# Patient Record
Sex: Female | Born: 1960 | Hispanic: No | Marital: Married | Smoking: Former smoker
Health system: Southern US, Community
[De-identification: ages and names within clinical notes are randomized; demographics above are authoritative.]

## PROBLEM LIST (undated history)

## (undated) DIAGNOSIS — F32A Depression, unspecified: Secondary | ICD-10-CM

## (undated) DIAGNOSIS — N189 Chronic kidney disease, unspecified: Secondary | ICD-10-CM

## (undated) DIAGNOSIS — F329 Major depressive disorder, single episode, unspecified: Secondary | ICD-10-CM

## (undated) DIAGNOSIS — R519 Headache, unspecified: Secondary | ICD-10-CM

## (undated) DIAGNOSIS — H9313 Tinnitus, bilateral: Secondary | ICD-10-CM

## (undated) DIAGNOSIS — F419 Anxiety disorder, unspecified: Secondary | ICD-10-CM

## (undated) DIAGNOSIS — K219 Gastro-esophageal reflux disease without esophagitis: Secondary | ICD-10-CM

## (undated) DIAGNOSIS — M948X9 Other specified disorders of cartilage, unspecified sites: Secondary | ICD-10-CM

## (undated) DIAGNOSIS — H9319 Tinnitus, unspecified ear: Secondary | ICD-10-CM

## (undated) DIAGNOSIS — R232 Flushing: Secondary | ICD-10-CM

## (undated) DIAGNOSIS — K5904 Chronic idiopathic constipation: Secondary | ICD-10-CM

## (undated) DIAGNOSIS — R51 Headache: Secondary | ICD-10-CM

## (undated) HISTORY — DX: Flushing: R23.2

## (undated) HISTORY — DX: Gastro-esophageal reflux disease without esophagitis: K21.9

## (undated) HISTORY — DX: Anxiety disorder, unspecified: F41.9

## (undated) HISTORY — DX: Chronic kidney disease, unspecified: N18.9

## (undated) HISTORY — DX: Chronic idiopathic constipation: K59.04

## (undated) HISTORY — PX: OTHER SURGICAL HISTORY: SHX169

## (undated) HISTORY — DX: Other specified disorders of cartilage, unspecified sites: M94.8X9

## (undated) HISTORY — DX: Tinnitus, bilateral: H93.13

## (undated) HISTORY — DX: Tinnitus, unspecified ear: H93.19

## (undated) HISTORY — DX: Headache, unspecified: R51.9

## (undated) HISTORY — PX: TONSILECTOMY, ADENOIDECTOMY, BILATERAL MYRINGOTOMY AND TUBES: SHX2538

## (undated) HISTORY — DX: Depression, unspecified: F32.A

## (undated) HISTORY — DX: Major depressive disorder, single episode, unspecified: F32.9

## (undated) HISTORY — DX: Headache: R51

## (undated) HISTORY — PX: TONSILLECTOMY: SUR1361

## (undated) HISTORY — PX: APPENDECTOMY: SHX54

---

## 2008-07-01 ENCOUNTER — Ambulatory Visit (HOSPITAL_COMMUNITY): Admission: RE | Admit: 2008-07-01 | Discharge: 2008-07-01 | Payer: Self-pay | Admitting: Family Medicine

## 2008-07-29 ENCOUNTER — Other Ambulatory Visit: Admission: RE | Admit: 2008-07-29 | Discharge: 2008-07-29 | Payer: Self-pay | Admitting: Obstetrics and Gynecology

## 2009-09-20 ENCOUNTER — Other Ambulatory Visit: Admission: RE | Admit: 2009-09-20 | Discharge: 2009-09-20 | Payer: Self-pay | Admitting: Obstetrics and Gynecology

## 2011-03-31 ENCOUNTER — Other Ambulatory Visit: Payer: Self-pay | Admitting: Adult Health

## 2011-03-31 ENCOUNTER — Other Ambulatory Visit (HOSPITAL_COMMUNITY)
Admission: RE | Admit: 2011-03-31 | Discharge: 2011-03-31 | Disposition: A | Discharge status: Home / Self Care | Payer: 59 | Source: Ambulatory Visit | Attending: Obstetrics and Gynecology | Admitting: Obstetrics and Gynecology

## 2011-03-31 DIAGNOSIS — Z01419 Encounter for gynecological examination (general) (routine) without abnormal findings: Secondary | ICD-10-CM | POA: Insufficient documentation

## 2011-04-26 ENCOUNTER — Other Ambulatory Visit: Payer: Self-pay | Admitting: Obstetrics and Gynecology

## 2012-04-25 ENCOUNTER — Other Ambulatory Visit: Payer: Self-pay | Admitting: Otolaryngology

## 2012-04-25 DIAGNOSIS — M542 Cervicalgia: Secondary | ICD-10-CM

## 2012-08-28 ENCOUNTER — Other Ambulatory Visit: Payer: Self-pay | Admitting: Adult Health

## 2012-08-28 ENCOUNTER — Other Ambulatory Visit (HOSPITAL_COMMUNITY)
Admission: RE | Admit: 2012-08-28 | Discharge: 2012-08-28 | Disposition: A | Discharge status: Home / Self Care | Payer: No Typology Code available for payment source | Source: Ambulatory Visit | Attending: Obstetrics and Gynecology | Admitting: Obstetrics and Gynecology

## 2012-08-28 DIAGNOSIS — Z1151 Encounter for screening for human papillomavirus (HPV): Secondary | ICD-10-CM | POA: Insufficient documentation

## 2012-08-28 DIAGNOSIS — Z01419 Encounter for gynecological examination (general) (routine) without abnormal findings: Secondary | ICD-10-CM | POA: Insufficient documentation

## 2013-10-15 ENCOUNTER — Encounter: Payer: Self-pay | Admitting: Adult Health

## 2013-10-15 ENCOUNTER — Ambulatory Visit (INDEPENDENT_AMBULATORY_CARE_PROVIDER_SITE_OTHER): Payer: BC Managed Care – PPO | Admitting: Adult Health

## 2013-10-15 VITALS — BP 124/70 | HR 74 | Ht 64.0 in | Wt 152.0 lb

## 2013-10-15 DIAGNOSIS — R232 Flushing: Secondary | ICD-10-CM

## 2013-10-15 DIAGNOSIS — H9319 Tinnitus, unspecified ear: Secondary | ICD-10-CM

## 2013-10-15 DIAGNOSIS — F339 Major depressive disorder, recurrent, unspecified: Secondary | ICD-10-CM | POA: Insufficient documentation

## 2013-10-15 DIAGNOSIS — F329 Major depressive disorder, single episode, unspecified: Secondary | ICD-10-CM

## 2013-10-15 DIAGNOSIS — Z01419 Encounter for gynecological examination (general) (routine) without abnormal findings: Secondary | ICD-10-CM

## 2013-10-15 DIAGNOSIS — Z1212 Encounter for screening for malignant neoplasm of rectum: Secondary | ICD-10-CM

## 2013-10-15 DIAGNOSIS — F32A Depression, unspecified: Secondary | ICD-10-CM

## 2013-10-15 HISTORY — DX: Tinnitus, unspecified ear: H93.19

## 2013-10-15 HISTORY — DX: Flushing: R23.2

## 2013-10-15 LAB — HEMOCCULT GUIAC POC 1CARD (OFFICE): Fecal Occult Blood, POC: NEGATIVE

## 2013-10-15 NOTE — Progress Notes (Signed)
Patient ID: Melanie Jimenez, female   DOB: [birth year], 53 y.o.   MRN: [id number] History of Present Illness: Melanie Jimenez is a 53 year old white female, married in for a physical.She had a normal pap with negative HPV 08/28/12.   Current Medications, Allergies, Past Medical History, Past Surgical History, Family History and Social History were reviewed in [employer] record.   Current outpatient prescriptions:CRESTOR 10 MG tablet, Take 10 mg by mouth daily. , Disp: , Rfl: ;  Escitalopram Oxalate (LEXAPRO PO), Take 35 mg by mouth daily., Disp: , Rfl: ;  LORazepam (ATIVAN) 0.5 MG tablet, Take 0.5 mg by mouth every 6 (six) hours as needed for anxiety., Disp: , Rfl: ;  pantoprazole (PROTONIX) 40 MG tablet, Take 40 mg by mouth daily. , Disp: , Rfl:  zolpidem (AMBIEN) 10 MG tablet, Take 10 mg by mouth at bedtime as needed. , Disp: , Rfl:  Past Medical History  Diagnosis Date  . Depression   . GERD (gastroesophageal reflux disease)   . Tinnitus of both ears   . Hot flashes 10/15/2013  . Tinnitus 10/15/2013   Past Surgical History  Procedure Laterality Date  . Cesarean section    . Lasix surgery    . Tonsilectomy, adenoidectomy, bilateral myringotomy and tubes     Review of Systems: Patient denies any blurred vision, shortness of breath, chest pain, abdominal pain, problems with bowel movements, urination, or intercourse. No joint pain, she has had some headaches and ringing in her ears and depression and has seen Dr Nadara Mustard in [place] and has had meds changed.She has seen and ENT and now has appt 4/20 with neurologist and appt with Dr Sima Matas 11/03/13.She has been out of work for about 4 weeks. She is having hot flashes and night sweats and her last period was about 14 months ago.   Physical Exam:BP 124/70  Pulse 74  Ht 5\' 4"  (1.626 m)  Wt 152 lb (68.947 kg)  BMI 26.08 kg/m2 General:  Well developed, well nourished, no acute distress Skin:  Warm and dry Neck:  Midline trachea, normal  thyroid, no carotid bruits heard Lungs; Clear to auscultation bilaterally Breast:  No dominant palpable mass, retraction, or nipple discharge Cardiovascular: Regular rate and rhythm Abdomen:  Soft, non tender, no hepatosplenomegaly Pelvic:  External genitalia is normal in appearance.  The vagina is normal in appearance. The cervix is bulbous.  Uterus is felt to be normal size, shape, and contour.  No adnexal masses or tenderness noted. Rectal: Good sphincter tone, no polyps, or hemorrhoids felt.  Hemoccult negative. Extremities:  No swelling or varicosities noted Psych:  Alert and cooperative, seems stressed Discussed HRT but wait til after sees neurologist.  Impression: Yearly gyn exam no pap Hot flashes Depression Tinnitus     Plan: Physical in 1 year Mammogram yearly Colonoscopy advised Labs with PCP done last week, and F/U with Dr Nadara Mustard in am Review handouts on HRT and menopause Call after visit with neurologist and will discuss HRT options then

## 2013-10-15 NOTE — Patient Instructions (Signed)
Physical in 1 year Mammogram yearly  colonoscopy at 25 Review handouts on menopause and HRT Menopause Menopause is the normal time of life when menstrual periods stop completely. Menopause is complete when you have missed 12 consecutive menstrual periods. It usually occurs between the ages of 102 years and 21 years. Very rarely does a woman develop menopause before the age of 58 years. At menopause, your ovaries stop producing the female hormones estrogen and progesterone. This can cause undesirable symptoms and also affect your health. Sometimes the symptoms may occur 4 5 years before the menopause begins. There is no relationship between menopause and:  Oral contraceptives.  Number of children you had.  Race.  The age your menstrual periods started (menarche). Heavy smokers and very thin women may develop menopause earlier in life. CAUSES  The ovaries stop producing the female hormones estrogen and progesterone.  Other causes include:  Surgery to remove both ovaries.  The ovaries stop functioning for no known reason.  Tumors of the pituitary gland in the brain.  Medical disease that affects the ovaries and hormone production.  Radiation treatment to the abdomen or pelvis.  Chemotherapy that affects the ovaries. SYMPTOMS   Hot flashes.  Night sweats.  Decrease in sex drive.  Vaginal dryness and thinning of the vagina causing painful intercourse.  Dryness of the skin and developing wrinkles.  Headaches.  Tiredness.  Irritability.  Memory problems.  Weight gain.  Bladder infections.  Hair growth of the face and chest.  Infertility. More serious symptoms include:  Loss of bone (osteoporosis) causing breaks (fractures).  Depression.  Hardening and narrowing of the arteries (atherosclerosis) causing heart attacks and strokes. DIAGNOSIS   When the menstrual periods have stopped for 12 straight months.  Physical exam.  Hormone studies of the  blood. TREATMENT  There are many treatment choices and nearly as many questions about them. The decisions to treat or not to treat menopausal changes is an individual choice made with your health care provider. Your health care provider can discuss the treatments with you. Together, you can decide which treatment will work best for you. Your treatment choices may include:   Hormone therapy (estrogen and progesterone).  Non-hormonal medicines.  Treating the individual symptoms with medicine (for example antidepressants for depression).  Herbal medicines that may help specific symptoms.  Counseling by a psychiatrist or psychologist.  Group therapy.  Lifestyle changes including:  Eating healthy.  Regular exercise.  Limiting caffeine and alcohol.  Stress management and meditation.  No treatment. HOME CARE INSTRUCTIONS   Take the medicine your health care provider gives you as directed.  Get plenty of sleep and rest.  Exercise regularly.  Eat a diet that contains calcium (good for the bones) and soy products (acts like estrogen hormone).  Avoid alcoholic beverages.  Do not smoke.  If you have hot flashes, dress in layers.  Take supplements, calcium, and vitamin D to strengthen bones.  You can use over-the-counter lubricants or moisturizers for vaginal dryness.  Group therapy is sometimes very helpful.  Acupuncture may be helpful in some cases. SEEK MEDICAL CARE IF:   You are not sure you are in menopause.  You are having menopausal symptoms and need advice and treatment.  You are still having menstrual periods after age 37 years.  You have pain with intercourse.  Menopause is complete (no menstrual period for 12 months) and you develop vaginal bleeding.  You need a referral to a specialist (gynecologist, psychiatrist, or psychologist) for treatment. SEEK  IMMEDIATE MEDICAL CARE IF:   You have severe depression.  You have excessive vaginal bleeding.  You  fell and think you have a broken bone.  You have pain when you urinate.  You develop leg or chest pain.  You have a fast pounding heart beat (palpitations).  You have severe headaches.  You develop vision problems.  You feel a lump in your breast.  You have abdominal pain or severe indigestion. Document Released: 09/09/2003 Document Revised: 02/19/2013 Document Reviewed: 01/16/2013 [facility] Patient Information 2014 [place], [place]. Hormone Therapy At menopause, your body begins making less estrogen and progesterone hormones. This causes the body to stop having menstrual periods. This is because estrogen and progesterone hormones control your periods and menstrual cycle. A lack of estrogen may cause symptoms such as:  Hot flushes (or hot flashes).  Vaginal dryness.  Dry skin.  Loss of sex drive.  Risk of bone loss (osteoporosis). When this happens, you may choose to take hormone therapy to get back the estrogen lost during menopause. When the hormone estrogen is given alone, it is usually referred to as ET (Estrogen Therapy). When the hormone progestin is combined with estrogen, it is generally called HT (Hormone Therapy). This was formerly known as hormone replacement therapy (HRT). Your caregiver can help you make a decision on what will be best for you. The decision to use HT seems to change often as new studies are done. Many studies do not agree on the benefits of hormone replacement therapy. LIKELY BENEFITS OF HT INCLUDE PROTECTION FROM:  Hot Flushes (also called hot flashes) - A hot flush is a sudden feeling of heat that spreads over the face and body. The skin may redden like a blush. It is connected with sweats and sleep disturbance. Women going through menopause may have hot flushes a few times a month or several times per day depending on the woman.  Osteoporosis (bone loss)- Estrogen helps guard against bone loss. After menopause, a woman's bones slowly lose calcium and  become weak and brittle. As a result, bones are more likely to break. The hip, wrist, and spine are affected most often. Hormone therapy can help slow bone loss after menopause. Weight bearing exercise and taking calcium with vitamin D also can help prevent bone loss. There are also medications that your caregiver can prescribe that can help prevent osteoporosis.  Vaginal Dryness - Loss of estrogen causes changes in the vagina. Its lining may become thin and dry. These changes can cause pain and bleeding during sexual intercourse. Dryness can also lead to infections. This can cause burning and itching. (Vaginal estrogen treatment can help relieve pain, itching, and dryness.)  Urinary Tract Infections are more common after menopause because of lack of estrogen. Some women also develop urinary incontinence because of low estrogen levels in the vagina and bladder.  Possible other benefits of estrogen include a positive effect on mood and short-term memory in women. RISKS AND COMPLICATIONS  Using estrogen alone without progesterone causes the lining of the uterus to grow. This increases the risk of lining of the uterus (endometrial) cancer. Your caregiver should give another hormone called progestin if you have a uterus.  Women who take combined (estrogen and progestin) HT appear to have an increased risk of breast cancer. The risk appears to be small, but increases throughout the time that HT is taken.  Combined therapy also makes the breast tissue slightly denser which makes it harder to read mammograms (breast X-rays).  Combined, estrogen and  progesterone therapy can be taken together every day, in which case there may be spotting of blood. HT therapy can be taken cyclically in which case you will have menstrual periods. Cyclically means HT is taken for a set amount of days, then not taken, then this process is repeated.  HT may increase the risk of stroke, heart attack, breast cancer and forming  blood clots in your leg.  Transdermal estrogen (estrogen that is absorbed through the skin with a patch or a cream) may have more positive results with:  Cholesterol.  Blood pressure.  Blood clots. Having the following conditions may indicate you should not have HT:  Endometrial cancer.  Liver disease.  Breast cancer.  Heart disease.  History of blood clots.  Stroke. TREATMENT   If you choose to take HT and have a uterus, usually estrogen and progestin are prescribed.  Your caregiver will help you decide the best way to take the medications.  Possible ways to take estrogen include:  Pills.  Patches.  Gels.  Sprays.  Vaginal estrogen cream, rings and tablets.  It is best to take the lowest dose possible that will help your symptoms and take them for the shortest period of time that you can.  Hormone therapy can help relieve some of the problems (symptoms) that affect women at menopause. Before making a decision about HT, talk to your caregiver about what is best for you. Be well informed and comfortable with your decisions. HOME CARE INSTRUCTIONS   Follow your caregivers advice when taking the medications.  A Pap test is done to screen for cervical cancer.  The first Pap test should be done at age 30.  Between ages 83 and 71, Pap tests are repeated every 2 years.  Beginning at age 54, you are advised to have a Pap test every 3 years as long as your past 3 Pap tests have been normal.  Some women have medical problems that increase the chance of getting cervical cancer. Talk to your caregiver about these problems. It is especially important to talk to your caregiver if a new problem develops soon after your last Pap test. In these cases, your caregiver may recommend more frequent screening and Pap tests.  The above recommendations are the same for women who have or have not gotten the vaccine for HPV (Human Papillomavirus).  If you had a hysterectomy for a  problem that was not a cancer or a condition that could lead to cancer, then you no longer need Pap tests. However, even if you no longer need a Pap test, a regular exam is a good idea to make sure no other problems are starting.   If you are between ages 57 and 90, and you have had normal Pap tests going back 10 years, you no longer need Pap tests. However, even if you no longer need a Pap test, a regular exam is a good idea to make sure no other problems are starting.   If you have had past treatment for cervical cancer or a condition that could lead to cancer, you need Pap tests and screening for cancer for at least 20 years after your treatment.  If Pap tests have been discontinued, risk factors (such as a new sexual partner) need to be re-assessed to determine if screening should be resumed.  Some women may need screenings more often if they are at high risk for cervical cancer.  Get mammograms done as per the advice of your caregiver. SEEK IMMEDIATE  MEDICAL CARE IF:  You develop abnormal vaginal bleeding.  You have pain or swelling in your legs, shortness of breath, or chest pain.  You develop dizziness or headaches.  You have lumps or changes in your breasts or armpits.  You have slurred speech.  You develop weakness or numbness of your arms or legs.  You have pain, burning, or bleeding when urinating.  You develop abdominal pain. Document Released: 03/18/2003 Document Revised: 09/11/2011 Document Reviewed: 07/06/2010 [facility] Patient Information 2014 [place], [place]. Call me back

## 2013-10-20 ENCOUNTER — Encounter: Payer: Self-pay | Admitting: Neurology

## 2013-10-20 ENCOUNTER — Ambulatory Visit (INDEPENDENT_AMBULATORY_CARE_PROVIDER_SITE_OTHER): Payer: BC Managed Care – PPO | Admitting: Neurology

## 2013-10-20 VITALS — BP 115/77 | HR 87 | Ht 64.0 in | Wt 152.0 lb

## 2013-10-20 DIAGNOSIS — G44209 Tension-type headache, unspecified, not intractable: Secondary | ICD-10-CM | POA: Insufficient documentation

## 2013-10-20 DIAGNOSIS — F411 Generalized anxiety disorder: Secondary | ICD-10-CM

## 2013-10-20 DIAGNOSIS — F419 Anxiety disorder, unspecified: Secondary | ICD-10-CM | POA: Insufficient documentation

## 2013-10-20 DIAGNOSIS — H9319 Tinnitus, unspecified ear: Secondary | ICD-10-CM

## 2013-10-20 NOTE — Patient Instructions (Signed)
I had a long discussion with patient with regards to her chronic tinnitus and underlying anxiety and depression. Recommend checking MRI scan of the brain and IAC, MRA of the brain to rule out structural or vascular lesions. Head recommend she take Ativan 0.5 mg twice daily in addition to her Lexapro to address underlying anxiety and depression. I also advised her to increase her participation in regular activity for stress relaxation to help with the tension headaches and to continue to use ibuprofen as needed but to limit to not more than 3 days per week. Return for followup in 6 weeks or call earlier if necessary.  Tinnitus Sounds you hear in your ears and coming from within the ear is called tinnitus. This can be a symptom of many ear disorders. It is often associated with hearing loss.  Tinnitus can be seen with:  Infections.  Ear blockages such as wax buildup.  Meniere's disease.  Ear damage.  Inherited.  Occupational causes. While irritating, it is not usually a threat to health. When the cause of the tinnitus is wax, infection in the middle ear, or foreign body it is easily treated. Hearing loss will usually be reversible.  TREATMENT  When treating the underlying cause does not get rid of tinnitus, it may be necessary to get rid of the unwanted sound by covering it up with more pleasant background noises. This may include music, the radio etc. There are tinnitus maskers which can be worn which produce background noise to cover up the tinnitus. Avoid all medications which tend to make tinnitus worse such as alcohol, caffeine, aspirin, and nicotine. There are many soothing background tapes such as rain, ocean, thunderstorms, etc. These soothing sounds help with sleeping or resting. Keep all follow-up appointments and referrals. This is important to identify the cause of the problem. It also helps avoid complications, impaired hearing, disability, or chronic pain. Document Released:  06/19/2005 Document Revised: 09/11/2011 Document Reviewed: 02/05/2008 [facility] Patient Information 2014 [place].  Tension Headache A tension headache is a feeling of pain, pressure, or aching often felt over the front and sides of the head. The pain can be dull or can feel tight (constricting). It is the most common type of headache. Tension headaches are not normally associated with nausea or vomiting and do not get worse with physical activity. Tension headaches can last 30 minutes to several days.  CAUSES  The exact cause is not known, but it may be caused by chemicals and hormones in the brain that lead to pain. Tension headaches often begin after stress, anxiety, or depression. Other triggers may include:  Alcohol.  Caffeine (too much or withdrawal).  Respiratory infections (colds, flu, sinus infections).  Dental problems or teeth clenching.  Fatigue.  Holding your head and neck in one position too long while using a computer. SYMPTOMS   Pressure around the head.   Dull, aching head pain.   Pain felt over the front and sides of the head.   Tenderness in the muscles of the head, neck, and shoulders. DIAGNOSIS  A tension headache is often diagnosed based on:   Symptoms.   Physical examination.   A CT scan or MRI of your head. These tests may be ordered if symptoms are severe or unusual. TREATMENT  Medicines may be given to help relieve symptoms.  HOME CARE INSTRUCTIONS   Only take over-the-counter or prescription medicines for pain or discomfort as directed by your caregiver.   Lie down in a dark, quiet room  when you have a headache.   Keep a journal to find out what may be triggering your headaches. For example, write down:  What you eat and drink.  How much sleep you get.  Any change to your diet or medicines.  Try massage or other relaxation techniques.   Ice packs or heat applied to the head and neck can be used. Use these 3 to 4 times per  day for 15 to 20 minutes each time, or as needed.   Limit stress.   Sit up straight, and do not tense your muscles.   Quit smoking if you smoke.  Limit alcohol use.  Decrease the amount of caffeine you drink, or stop drinking caffeine.  Eat and exercise regularly.  Get 7 to 9 hours of sleep, or as recommended by your caregiver.  Avoid excessive use of pain medicine as recurrent headaches can occur.  SEEK MEDICAL CARE IF:   You have problems with the medicines you were prescribed.  Your medicines do not work.  You have a change from the usual headache.  You have nausea or vomiting. SEEK IMMEDIATE MEDICAL CARE IF:   Your headache becomes severe.  You have a fever.  You have a stiff neck.  You have loss of vision.  You have muscular weakness or loss of muscle control.  You lose your balance or have trouble walking.  You feel faint or pass out.  You have severe symptoms that are different from your first symptoms. MAKE SURE YOU:   Understand these instructions.  Will watch your condition.  Will get help right away if you are not doing well or get worse. Document Released: 06/19/2005 Document Revised: 09/11/2011 Document Reviewed: 06/09/2011 [facility] Patient Information 2014 [place], [place].

## 2013-10-21 NOTE — Progress Notes (Signed)
[facility] [address] [place]. [place] [address] [phone]       OFFICE CONSULT NOTE  Melanie Jimenez Date of Birth:  1960-11-19 Medical Record Number:  [id number]   Referring MD:  Nobie Putnam  Reason for Referral:  Headache and tinnitus HPI: 63 year lady with chronic bilateral ringing sound in the ears for last 5 years. This appears to have increased in the last 6 months to a more pronounced rolling and swishing sound. She has been evaluated by Dr. Redmond Pulling ENT physician in [place] who has done audiometry in our office which has been okay. Repeat audiometry recently showed stable appearance over 5 years. She denies any accompanying vertigo, or double vision, balance or gait difficulties. The tinnitus increases with certain noises like a vacuum cleaner and at times she has to wear earplugs to decrease this sound. Going to sleep helps reduce this tinnitus. She occasionally feels slightly off balance when she tries to get up to this feeling does not last. She has not noticed any loss of hearing. There is no history of head injury with loss of consciousness, years fracture, bleeding or recurrent infections. She has been complaining of the daily headaches for the last 3-4 months which she describes as a frontal mild to moderate in severity pressure-like in quality 5/10 in severity. The headaches are not accompanied by nausea, vomiting, light or sound sensitivity. Headache does not increase his physical activity and she is able to continue to work despite the headache. She takes ibuprofen which seems to relieve the headaches. The patient has been taking Ativan off and on over the years to have tinnitus which helps a little PA she takes this about 3-4 times per week only. She has long-standing history of depression for which she had been on Celexa which was recently changed to Lexapro by her primary physician. She has not been on Klonopin. She has tried Xanax in the past. She has no  family history of aneurysms, edema admission, brain hemorrhage. She remembers having had brain scans from 5 years ago but I'm not able to find records of these. She does admit to some increased stress in her life last 6 months is not sure if this may have affected her ranging sound in the ears. ROS:   14 system review of systems is positive for hearing loss, ringing in ears, feeling hot, headache, dizziness, anxiety, depression, decreased energy, insomnia, sleepiness and all other systems negative PMH:  Past Medical History  Diagnosis Date  . Depression   . GERD (gastroesophageal reflux disease)   . Tinnitus of both ears   . Hot flashes 10/15/2013  . Tinnitus 10/15/2013    Social History:  History   Social History  . Marital Status: Unknown    Spouse Name: N/A    Number of Children: 1  . Years of Education: N/A   Occupational History  . Not on file.   Social History Main Topics  . Smoking status: Current Every Day Smoker -- 1.00 packs/day    Types: Cigarettes  . Smokeless tobacco: Never Used  . Alcohol Use: No  . Drug Use: No  . Sexual Activity: Yes    Birth Control/ Protection: Other-see comments     Comment: vasectomy   Other Topics Concern  . Not on file   Social History Narrative   Patient lives at home with her husband.    Medications:   Current Outpatient Prescriptions on File Prior to Visit  Medication Sig Dispense  Refill  . CRESTOR 10 MG tablet Take 10 mg by mouth daily.       . Escitalopram Oxalate (LEXAPRO PO) Take 35 mg by mouth daily.      Marland Kitchen LORazepam (ATIVAN) 0.5 MG tablet Take 0.5 mg by mouth every 6 (six) hours as needed for anxiety.      . pantoprazole (PROTONIX) 40 MG tablet Take 40 mg by mouth daily.       Marland Kitchen zolpidem (AMBIEN) 10 MG tablet Take 10 mg by mouth at bedtime as needed.        No current facility-administered medications on file prior to visit.    Allergies:   Allergies  Allergen Reactions  . Wellbutrin [Bupropion] Other (See  Comments)    Seizures and passed out    Physical Exam General: well developed, well nourished, seated, in no evident distress Head: head normocephalic and atraumatic. Orohparynx benign Neck: supple with no carotid or supraclavicular bruits Cardiovascular: regular rate and rhythm, no murmurs Musculoskeletal: no deformity Skin:  no rash/petichiae Vascular:  Normal pulses all extremities Filed Vitals:   10/20/13 1429  BP: 115/77  Pulse: 87    Neurologic Exam Mental Status: Awake and fully alert. Oriented to place and time. Recent and remote memory intact. Attention span, concentration and fund of knowledge appropriate. Mood and affect appropriate.  Cranial Nerves: Fundoscopic exam reveals sharp disc margins. Pupils equal, briskly reactive to light. Extraocular movements full without nystagmus. Visual fields full to confrontation. Hearing intact. Conduction greater than bone conduction in both ears. Facial sensation intact. Face, tongue, palate moves normally and symmetrically.  Motor: Normal bulk and tone. Normal strength in all tested extremity muscles. Sensory.: intact to touch and pinprick and vibration.  Coordination: Rapid alternating movements normal in all extremities. Finger-to-nose and heel-to-shin performed accurately bilaterally. Gait and Station: Arises from chair without difficulty. Stance is normal. Gait demonstrates normal stride length and balance . Able to heel, toe and tandem walk without difficulty.  Reflexes: 1+ and symmetric. Toes downgoing.      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 Recommend checking MRI scan of the brain and IAC, MRA of the brain to rule  out structural or vascular lesions. Head recommend she take Ativan 0.5 mg twice daily in addition to her Lexapro to address underlying anxiety and depression. I also advised her to increase her participation in regular activity for stress relaxation to help with the tension headaches and to continue to use ibuprofen as needed but to limit to not more than 3 days per week. Return for followup in 6 weeks or call earlier if necessary.   Note: This document was prepared with digital dictation and possible smart phrase technology. Any transcriptional errors that result from this process are unintentional.

## 2013-10-22 ENCOUNTER — Ambulatory Visit (INDEPENDENT_AMBULATORY_CARE_PROVIDER_SITE_OTHER): Payer: BC Managed Care – PPO

## 2013-10-22 DIAGNOSIS — G44209 Tension-type headache, unspecified, not intractable: Secondary | ICD-10-CM

## 2013-10-22 DIAGNOSIS — H9319 Tinnitus, unspecified ear: Secondary | ICD-10-CM

## 2013-10-22 MED ORDER — GADOPENTETATE DIMEGLUMINE 469.01 MG/ML IV SOLN: 15.0000 mL | Freq: Once | INTRAVENOUS | Status: AC | PRN

## 2013-10-28 ENCOUNTER — Telehealth: Payer: Self-pay | Admitting: Neurology

## 2013-10-28 NOTE — Telephone Encounter (Signed)
Pt called would like for someone to call her concerning the results from her MRI done on 10/22/13. Thanks

## 2013-10-28 NOTE — Telephone Encounter (Signed)
Pt is calling requesting MRI results. Please advise

## 2013-10-29 NOTE — Telephone Encounter (Signed)
Pt called back is concerned about not hearing on her results. Pt is waiting to hear back due to she has to get these results to her Dr. So that he can figure out what medication she needs to be on. Please call pt concerning this matter. Thanks

## 2013-10-30 ENCOUNTER — Telehealth: Payer: Self-pay | Admitting: *Deleted

## 2013-10-30 NOTE — Telephone Encounter (Signed)
Called patient home and left message that MRIs came back fine. Call back if questions otherwise keep f/u appointment

## 2013-10-30 NOTE — Telephone Encounter (Signed)
Pt state saw neurologist, MRI clear, Melanie Jimenez to discuss HRT. Pt informed Melanie Jimenez out of office until Monday will route message.

## 2013-10-31 ENCOUNTER — Ambulatory Visit (HOSPITAL_COMMUNITY): Payer: Self-pay | Admitting: Psychology

## 2013-11-03 ENCOUNTER — Ambulatory Visit (HOSPITAL_COMMUNITY): Payer: Self-pay | Admitting: Psychology

## 2013-11-03 ENCOUNTER — Telehealth: Payer: Self-pay | Admitting: *Deleted

## 2013-11-03 ENCOUNTER — Telehealth: Payer: Self-pay | Admitting: Adult Health

## 2013-11-03 MED ORDER — PROGESTERONE MICRONIZED 200 MG PO CAPS: ORAL_CAPSULE | ORAL | Status: DC

## 2013-11-03 MED ORDER — ESTRADIOL 0.52 MG/0.87 GM (0.06%) TD GEL: 1.0000 "application " | Freq: Every day | TRANSDERMAL | Status: DC

## 2013-11-03 NOTE — Telephone Encounter (Signed)
Pt saw Dr Nadara Mustard and he said OK to HRT and also changed her from lexapro to zoloft, will rx elestrin and prometrium, follow up in 3  Months with me

## 2013-11-03 NOTE — Telephone Encounter (Signed)
Pt wants to know what medication she will be given for HRT. A gel or lotion and another pill.

## 2013-11-03 NOTE — Telephone Encounter (Signed)
Pt calling back in regards to a previous message on Thursday for Derrek Monaco, NP. Pt states at home today can be reached at [phone], requesting HRT medication.

## 2013-11-03 NOTE — Telephone Encounter (Signed)
Has appt with PCP, OK with him taking HRT will rx elestrin and prometrium if he is OK with it.

## 2013-11-12 ENCOUNTER — Ambulatory Visit (INDEPENDENT_AMBULATORY_CARE_PROVIDER_SITE_OTHER): Payer: BC Managed Care – PPO | Admitting: Psychology

## 2013-11-12 DIAGNOSIS — F3289 Other specified depressive episodes: Secondary | ICD-10-CM

## 2013-11-12 DIAGNOSIS — F329 Major depressive disorder, single episode, unspecified: Secondary | ICD-10-CM

## 2013-11-12 DIAGNOSIS — G4700 Insomnia, unspecified: Secondary | ICD-10-CM

## 2013-12-04 ENCOUNTER — Ambulatory Visit (HOSPITAL_COMMUNITY): Payer: Self-pay | Admitting: Psychology

## 2013-12-12 ENCOUNTER — Encounter (HOSPITAL_COMMUNITY): Payer: Self-pay | Admitting: Psychology

## 2013-12-12 NOTE — Progress Notes (Signed)
PROGRESS NOTE  Patient:   Melanie Jimenez   DOB:   April 21, 1961  MR Number:  [id number]  Location:  [facility] [address] [place] [address] Dept: [phone]           Date of Service:   11/12/2013  Start Time:   3 PM End Time:   4 PM  Provider/Observer:  Edgardo Roys PSYD       Billing Code/Service: [phone]  Chief Complaint:     Chief Complaint  Patient presents with  . Depression  . Other    Insomnia    Reason for Service:  The patient was referred by Dr. Redmond Pulling who is her ENT. The patient is a 53 year old female who is been dealing with depression partly due to menopause as well as issues with TMJ and audiological issues. The patient describes constant ringing in her ears and tinnitus as well as a sense of hearing her heartbeat in her ears. The patient reports that this tinnitus is been going on for 5 years and a more worsening symptoms over the past 6 or 7. The patient reports that she's feeling increasingly depressed because she is not able to work and has no energy. The patient describes TMJ pain, inability to sleep, bruxism,. The patient reports that she has seen both a neurologist and her ENT. The patient reports that she has no balance issues and that she is getting some help from a mouth guard. The patient reports that she has been going through menopause as well. She is currently taking Zoloft in the past of both Celexa and Lexapro. A major issue that is going on with her now is that in October of the neighbors move in next door with 5 children and 12 dogs. The dogs bark all of the time both day and night. Tumor change in the backyard and she and her: Share about them. After several weeks this the family 2K dogs but she continues to have problems with her children the other dogs.  Current Status:  The patient describes moderate to significant symptoms of depression, anxiety, mood  changes, sleep disturbance, racing thoughts, memory difficulties, loss of interest, excessive worrying, low energy, obsessive thoughts, changes in sex drive and poor concentration.  Reliability of Information: The information is provided by the patient as well as review of available medical records.  Behavioral Observation: Melanie Jimenez  presents as a 53 y.o.-year-old Right Caucasian Female who appeared her stated age. her dress was Appropriate and she was Well Groomed and her manners were Appropriate to the situation.  There were not any physical disabilities noted.  she displayed an appropriate level of cooperation and motivation.    Interactions:    Active   Attention:   within normal limits  Memory:   within normal limits  Visuo-spatial:   within normal limits  Speech (Volume):  normal  Speech:   normal pitch  Thought Process:  Coherent  Though Content:  WNL  Orientation:   person, place, time/date and situation  Judgment:   Good  Planning:   Good  Affect:    Depressed  Mood:    Depressed  Insight:   Good  Intelligence:   normal  Marital Status/Living: The patient was born and raised in [place]. She is married and has a 23 year old daughter. The patient has a 56 year old brother and a 46 year old brother.  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  Past Employment:    Substance Use:  No concerns of substance abuse are reported.  the patient denies any history of substance abuse. She has not had any alcohol since at least 2000 working.  Education:   HS Graduate  Medical History:   Past Medical History  Diagnosis Date  . Depression   . GERD (gastroesophageal reflux disease)   . Tinnitus of both ears   . Hot flashes 10/15/2013  . Tinnitus 10/15/2013        Outpatient Encounter Prescriptions as of 11/12/2013  Medication Sig  . CRESTOR 10 MG tablet Take 10 mg by mouth  daily.   . Escitalopram Oxalate (LEXAPRO PO) Take 35 mg by mouth daily.  . Estradiol 0.52 MG/0.87 GM (0.06%) GEL Apply 1 application topically daily.  Marland Kitchen LORazepam (ATIVAN) 0.5 MG tablet Take 0.5 mg by mouth every 6 (six) hours as needed for anxiety.  . pantoprazole (PROTONIX) 40 MG tablet Take 40 mg by mouth daily.   . progesterone (PROMETRIUM) 200 MG capsule Take 1 at bedtime  . zolpidem (AMBIEN) 10 MG tablet Take 10 mg by mouth at bedtime as needed.           Sexual History:   History  Sexual Activity  . Sexual Activity: Yes  . Birth Control/ Protection: Other-see comments    Comment: vasectomy    Abuse/Trauma History: The patient denies any history of abuse or trauma. However, she has been extremely stressed by her new neighbors who have many kids in dogs work possibly causing trouble around her house.  Psychiatric History:  The patient does report that she has had difficulties with depression the past. She reports that she did not have any significant benefit from either Celexa or Lexapro in the past. She reports that she's been taking Zoloft for one week.  Family Med/Psych History:  Family History  Problem Relation Age of Onset  . Diabetes Mother   . Diabetes Father   . Diabetes Maternal Grandmother   . Diabetes Maternal Grandfather     Risk of Suicide/Violence: virtually non-existent the patient denies any suicidal ideation.  Impression/DX:  The patient has not been going through menopause recently but she is also been developing a lot of anxiety and depression symptoms with significant tinnitus in her years, a roaring and sound of her heartbeat, bruxism, and other TMJ symptoms. The patient reports that she has a lot of stress going on in her life as well primarily related to her new Neighbors.  Disposition/Plan:  We will set the patient for individual psychotherapeutic interventions as well as looking at any recommendations to facilitate with psychotropic  interventions.  Diagnosis:    Axis I:  Depressive disorder, not elsewhere classified  Insomnia      Axis II: No diagnosis       Axis III:  Tinnitus, bruxism, and other ENT difficulties.      Axis IV:  housing problems          Axis V:  51-60 moderate symptoms

## 2014-01-22 ENCOUNTER — Other Ambulatory Visit: Payer: Self-pay | Admitting: *Deleted

## 2014-01-22 ENCOUNTER — Encounter: Payer: Self-pay | Admitting: Vascular Surgery

## 2014-01-22 DIAGNOSIS — R0989 Other specified symptoms and signs involving the circulatory and respiratory systems: Secondary | ICD-10-CM

## 2014-01-23 ENCOUNTER — Ambulatory Visit (INDEPENDENT_AMBULATORY_CARE_PROVIDER_SITE_OTHER): Payer: BC Managed Care – PPO | Admitting: Vascular Surgery

## 2014-01-23 ENCOUNTER — Encounter: Payer: Self-pay | Admitting: Vascular Surgery

## 2014-01-23 ENCOUNTER — Ambulatory Visit (HOSPITAL_COMMUNITY)
Admission: RE | Admit: 2014-01-23 | Discharge: 2014-01-23 | Disposition: A | Discharge status: Home / Self Care | Payer: BC Managed Care – PPO | Source: Ambulatory Visit | Attending: Vascular Surgery | Admitting: Vascular Surgery

## 2014-01-23 VITALS — BP 121/85 | HR 80 | Ht 64.0 in | Wt 152.8 lb

## 2014-01-23 DIAGNOSIS — G5691 Unspecified mononeuropathy of right upper limb: Secondary | ICD-10-CM | POA: Insufficient documentation

## 2014-01-23 DIAGNOSIS — G5690 Unspecified mononeuropathy of unspecified upper limb: Secondary | ICD-10-CM

## 2014-01-23 DIAGNOSIS — R0989 Other specified symptoms and signs involving the circulatory and respiratory systems: Secondary | ICD-10-CM | POA: Diagnosis present

## 2014-01-23 NOTE — Progress Notes (Signed)
[facility]  Referred by:  Rory Percy, MD [address], [place] [address]  Reason for referral: prominent pulsation of the right wrist  History of Present Illness  Melanie Jimenez is a 53 y.o. ([birth year]) female who presents with chief complaint: prominent pulsation of the right wrist x 5 days. She also describes knots in her right antecubital fossa and intermittent burning and numbness of her right forearm x 3 years. She says these symptoms started immediately following a blood draw in 2012. She said the phlebotomist was unsuccessful at drawing her blood and had to try multiple times. She has seen a neurologist about this issue and underwent an MRI. The results were normal. She has not tried any neuropathic medications such as gabapentin.   She denies any history of pain or pallor in the right hand and non-healing wounds of her right hand. She has no family history of aneurysm. She denies a previous history of stroke and clotting disorders.   Past Medical History  Diagnosis Date  . Depression   . GERD (gastroesophageal reflux disease)   . Tinnitus of both ears   . Hot flashes 10/15/2013  . Tinnitus 10/15/2013    Past Surgical History  Procedure Laterality Date  . Cesarean section    . Lasix surgery    . Tonsilectomy, adenoidectomy, bilateral myringotomy and tubes      History   Social History  . Marital Status: Unknown    Spouse Name: N/A    Number of Children: 1  . Years of Education: N/A   Occupational History  . Not on file.   Social History Main Topics  . Smoking status: Current Every Day Smoker -- 1.00 packs/day    Types: Cigarettes  . Smokeless tobacco: Never Used  . Alcohol Use: Yes     Comment: occasional  . Drug Use: No  . Sexual Activity: Yes    Birth Control/ Protection: Other-see comments     Comment: vasectomy   Other Topics Concern  . Not on file   Social History Narrative   Patient lives at home with her husband.     Family History  Problem Relation Age of Onset  . Diabetes Mother   . Hyperlipidemia Mother   . Diabetes Father   . Peripheral vascular disease Father   . Diabetes Maternal Grandmother   . Diabetes Maternal Grandfather     No family history of aneurysms.   Current Outpatient Prescriptions on File Prior to Visit  Medication Sig Dispense Refill  . CRESTOR 10 MG tablet Take 10 mg by mouth daily.       . Escitalopram Oxalate (LEXAPRO PO) Take 35 mg by mouth daily.      Marland Kitchen LORazepam (ATIVAN) 0.5 MG tablet Take 0.5 mg by mouth every 6 (six) hours as needed for anxiety.      . progesterone (PROMETRIUM) 200 MG capsule Take 1 at bedtime  30 capsule  6  . zolpidem (AMBIEN) 10 MG tablet Take 10 mg by mouth at bedtime as needed.       . Estradiol 0.52 MG/0.87 GM (0.06%) GEL Apply 1 application topically daily.  26 g  6  . pantoprazole (PROTONIX) 40 MG tablet Take 40 mg by mouth daily.        No current facility-administered medications on file prior to visit.    Allergies  Allergen Reactions  . Wellbutrin [Bupropion] Other (See Comments)    Seizures and passed out  REVIEW OF SYSTEMS:  (Positives checked otherwise negative)  CARDIOVASCULAR:  []  chest pain, []  chest pressure, []  palpitations, []  shortness of breath when laying flat, []  shortness of breath with exertion,  []  pain in feet when walking, []  pain in feet when laying flat, []  history of blood clot in veins (DVT), []  history of phlebitis, []  swelling in legs, []  varicose veins  PULMONARY:  []  productive cough, []  asthma, []  wheezing  NEUROLOGIC:  []  weakness in arms or legs, [x]  numbness in arms or legs, []  difficulty speaking or slurred speech, []  temporary loss of vision in one eye, []  dizziness  HEMATOLOGIC:  []  bleeding problems, []  problems with blood clotting too easily  MUSCULOSKEL:  []  joint pain, []  joint swelling  GASTROINTEST:  []  vomiting blood, []  blood in stool     GENITOURINARY:  []  burning with  urination, []  blood in urine  PSYCHIATRIC:  []  history of major depression  INTEGUMENTARY:  []  rashes, []  ulcers  CONSTITUTIONAL:  []  fever, []  chills  For VQI Use Only  Physical Examination  Filed Vitals:   01/23/14 1314  BP: 121/85  Pulse: 80  Height: 5\' 4"  (1.626 m)  Weight: 152 lb 12.8 oz (69.31 kg)  SpO2: 100%    Body mass index is 26.22 kg/(m^2).  General: A&O x 3, WDWN in NAD  Head: [place]/AT  Eyes: PERRLA, EOMI  Neck: Supple  Pulmonary: Sym exp, good air movt, CTAB, no rales, rhonchi, & wheezing  Cardiac: RRR, Nl S1, S2, no Murmurs, rubs or gallops  Vascular: Prominent pulsation of right ulnar artery. No ischemic changes of right hand. Palpable cord in medial aspect of right forearm.  Vessel Right Left  Radial Palpable Palpable  Ulnar Palpable Palpable  Brachial Palpable Palpable  Carotid Palpable, without bruit Palpable, without bruit  Aorta Not palpable N/A  Femoral Palpable Palpable  Popliteal Not palpable Not palpable  PT  Palpable  Palpable  DP  Palpable  Palpable   Gastrointestinal: soft, NTND, -G/R, - HSM, - masses  Musculoskeletal: M/S 5/5 throughout, Extremities without ischemic changes.   Neurologic: CN 2-12 intact. Pain and light touch intact in extremities   Psychiatric: Judgment intact, Mood & affect appropriate for pt's clinical situation  Dermatologic: See M/S exam for extremity exam, no rashes otherwise noted  Non-Invasive Vascular Imaging  Upper extremity arterial duplex   Normal arterial waveforms and flow in the right brachial, radial and ulnar arteries in right extremity.   Medical Decision Making  Melanie Jimenez is a 53 y.o. 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    Ultrasound of the radial and ulnar arteries revealed no evidence of aneurysm with normal arterial waveforms. Discussed with the patient that she has a more prominent ulnar artery that is a  normal anatomical variant.   Advised patient to take non-steroidal antiinflammatory medications for thrombophlebitis pain.  Follow-up as needed. Patient informed to return to office if pulse became more prominent or had evidence of embolic disease in the hand.    Virgina Jock, PA-C [facility] Office: [phone] Pager: [phone]  01/23/2014, 2:05 PM  This patient was seen in conjunction with Dr. Bridgett Larsson.   Addendum  I have independently interviewed and examined the patient, and I agree with the physician assistant's findings.  Pt has painful palpable cord in forearm.  I duplexed the wrist to evaluate the ulnar and radial arteries and found to abnormalities in the arteries or  surround tissue.  It is no clear to me why her wrist arteries suddenly became more prominent in her R arm.  There is no evidence of distal embolization in the right hand that might give a more prominent pulse.  Adele Barthel, MD [facility] Office: [phone] Pager: [phone]  01/23/2014, 3:07 PM

## 2014-02-03 ENCOUNTER — Ambulatory Visit: Payer: Self-pay | Admitting: Neurology

## 2014-03-02 ENCOUNTER — Ambulatory Visit (INDEPENDENT_AMBULATORY_CARE_PROVIDER_SITE_OTHER): Payer: BC Managed Care – PPO | Admitting: Neurology

## 2014-03-02 ENCOUNTER — Encounter: Payer: Self-pay | Admitting: Neurology

## 2014-03-02 ENCOUNTER — Ambulatory Visit: Payer: BC Managed Care – PPO | Admitting: Neurology

## 2014-03-02 ENCOUNTER — Encounter: Payer: Self-pay | Admitting: *Deleted

## 2014-03-02 VITALS — Ht 64.0 in

## 2014-03-02 DIAGNOSIS — M25531 Pain in right wrist: Secondary | ICD-10-CM

## 2014-03-02 DIAGNOSIS — M25539 Pain in unspecified wrist: Secondary | ICD-10-CM

## 2014-03-02 DIAGNOSIS — G5641 Causalgia of right upper limb: Secondary | ICD-10-CM | POA: Insufficient documentation

## 2014-03-02 DIAGNOSIS — G5640 Causalgia of unspecified upper limb: Secondary | ICD-10-CM

## 2014-03-02 DIAGNOSIS — G90511 Complex regional pain syndrome I of right upper limb: Secondary | ICD-10-CM

## 2014-03-02 DIAGNOSIS — M8900 Algoneurodystrophy, unspecified site: Secondary | ICD-10-CM

## 2014-03-02 MED ORDER — TOPIRAMATE 50 MG PO TABS: 50.0000 mg | ORAL_TABLET | Freq: Two times a day (BID) | ORAL | Status: DC

## 2014-03-02 NOTE — Patient Instructions (Addendum)
I had a long discussion with the patient and her mother regarding her right wrist pulsatile swelling, discussed the differential diagnosis, plan for evaluation and treatment and answered questions. Trial of Topamax 50 mg twice daily for causalgia and neuropathy pain. Check EMG nerve conduction study for median nerve damage and MRI scan of the right wrist soft tissues to evaluate the pulsatile swelling.The patient was advised to not return to work till about evaluation is completed. Return for followup in 2 months or call earlier if necessary. Neuropathic Pain We often think that pain has a physical cause. If we get rid of the cause, the pain should go away. Nerves themselves can also cause pain. It is called neuropathic pain, which means nerve abnormality. It may be difficult for the patients who have it and for the treating caregivers. Pain is usually described as acute (short-lived) or chronic (long-lasting). Acute pain is related to the physical sensations caused by an injury. It can last from a few seconds to many weeks, but it usually goes away when normal healing occurs. Chronic pain lasts beyond the typical healing time. With neuropathic pain, the nerve fibers themselves may be damaged or injured. They then send incorrect signals to other pain centers. The pain you feel is real, but the cause is not easy to find.  CAUSES  Chronic pain can result from diseases, such as diabetes and shingles (an infection related to chickenpox), or from trauma, surgery, or amputation. It can also happen without any known injury or disease. The nerves are sending pain messages, even though there is no identifiable cause for such messages.   Other common causes of neuropathy include diabetes, phantom limb pain, or Regional Pain Syndrome (RPS).  As with all forms of chronic back pain, if neuropathy is not correctly treated, there can be a number of associated problems that lead to a downward cycle for the patient. These  include depression, sleeplessness, feelings of fear and anxiety, limited social interaction and inability to do normal daily activities or work.  The most dramatic and mysterious example of neuropathic pain is called "phantom limb syndrome." This occurs when an arm or a leg has been removed because of illness or injury. The brain still gets pain messages from the nerves that originally carried impulses from the missing limb. These nerves now seem to misfire and cause troubling pain.  Neuropathic pain often seems to have no cause. It responds poorly to standard pain treatment. Neuropathic pain can occur after:  Shingles (herpes zoster virus infection).  A lasting burning sensation of the skin, caused usually by injury to a peripheral nerve.  Peripheral neuropathy which is widespread nerve damage, often caused by diabetes or alcoholism.  Phantom limb pain following an amputation.  Facial nerve problems (trigeminal neuralgia).  Multiple sclerosis.  Reflex sympathetic dystrophy.  Pain which comes with cancer and cancer chemotherapy.  Entrapment neuropathy such as when pressure is put on a nerve such as in carpal tunnel syndrome.  Back, leg, and hip problems (sciatica).  Spine or back surgery.  HIV Infection or AIDS where nerves are infected by viruses. Your caregiver can explain items in the above list which may apply to you. SYMPTOMS  Characteristics of neuropathic pain are:  Severe, sharp, electric shock-like, shooting, lightening-like, knife-like.  Pins and needles sensation.  Deep burning, deep cold, or deep ache.  Persistent numbness, tingling, or weakness.  Pain resulting from light touch or other stimulus that would not usually cause pain.  Increased sensitivity to something  that would normally cause pain, such as a pinprick. Pain may persist for months or years following the healing of damaged tissues. When this happens, pain signals no longer sound an alarm about  current injuries or injuries about to happen. Instead, the alarm system itself is not working correctly.  Neuropathic pain may get worse instead of better over time. For some people, it can lead to serious disability. It is important to be aware that severe injury in a limb can occur without a proper, protective pain response.Burns, cuts, and other injuries may go unnoticed. Without proper treatment, these injuries can become infected or lead to further disability. Take any injury seriously, and consult your caregiver for treatment. DIAGNOSIS  When you have a pain with no known cause, your caregiver will probably ask some specific questions:   Do you have any other conditions, such as diabetes, shingles, multiple sclerosis, or HIV infection?  How would you describe your pain? (Neuropathic pain is often described as shooting, stabbing, burning, or searing.)  Is your pain worse at any time of the day? (Neuropathic pain is usually worse at night.)  Does the pain seem to follow a certain physical pathway?  Does the pain come from an area that has missing or injured nerves? (An example would be phantom limb pain.)  Is the pain triggered by minor things such as rubbing against the sheets at night? These questions often help define the type of pain involved. Once your caregiver knows what is happening, treatment can begin. Anticonvulsant, antidepressant drugs, and various pain relievers seem to work in some cases. If another condition, such as diabetes is involved, better management of that disorder may relieve the neuropathic pain.  TREATMENT  Neuropathic pain is frequently long-lasting and tends not to respond to treatment with narcotic type pain medication. It may respond well to other drugs such as antiseizure and antidepressant medications. Usually, neuropathic problems do not completely go away, but partial improvement is often possible with proper treatment. Your caregivers have large numbers of  medications available to treat you. Do not be discouraged if you do not get immediate relief. Sometimes different medications or a combination of medications will be tried before you receive the results you are hoping for. See your caregiver if you have pain that seems to be coming from nowhere and does not go away. Help is available.  SEEK IMMEDIATE MEDICAL CARE IF:   There is a sudden change in the quality of your pain, especially if the change is on only one side of the body.  You notice changes of the skin, such as redness, black or purple discoloration, swelling, or an ulcer.  You cannot move the affected limbs. Document Released: 03/16/2004 Document Revised: 09/11/2011 Document Reviewed: 03/16/2004 [facility] Patient Information 2015 [place], [place]. This information is not intended to replace advice given to you by your health care provider. Make sure you discuss any questions you have with your health care provider.

## 2014-03-02 NOTE — Progress Notes (Signed)
[facility] [address] [place]. [place] [address] [phone]       OFFICE CONSULT NOTE  Ms. Melanie Jimenez Date of Birth:  01-29-1961 Medical Record Number:  [id number]   Referring MD: Rory Percy  Reason for Referral:  Right hand pain and swelling  HPI: 20 year Caucasian lady who has been having right forearm and hand pain, numbness and tingling off and on following a blood draw from the right wrist with multiple failed attempts by phlebotomist in August 2011. She had immediate pain at that time. She underwent x-ray of the forearm and elbow on 02/03/2010 which I personally reviewed were unremarkable. Since July 22 this year she'll notice swelling and pain and pulsation over the ventral aspect of the right wrist. The pain is so severe that she can barely use her arm. She's also been complaining of generalized weakness and inability to work. She has had poor appetite and diarrhea and has lost some weight as well. She has not tried any specific medication for this pain. She was seen by vascular surgeon Dr. Adele Barthel 01/23/14 who found the patient to have a palpable cord in the forearm and did an ultrasound of the radial and ulnar arteries which revealed no evidence of aneurysm with normal arterial waveforms. He felt the patient had a prominent ulnar artery which was a normal anatomical variant. Nonsteroidal anti-inflammatory medications recommended that the patient continues to have severe pain and burning. 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 Patient takes lorazepam for her tinnitus which seems to be unchanged.  ROS:   14 system review of systems is positive for pain, fatigue, tinnitus,  cough, diarrhea, anxiety or depression, decreased energy, change in appetite and his interest in activities. PMH:  Past Medical History  Diagnosis Date  . Depression   . GERD (gastroesophageal reflux disease)   . Tinnitus of both ears   . Hot flashes 10/15/2013  . Tinnitus 10/15/2013    Social History:  History   Social History  . Marital Status: Unknown    Spouse Name: N/A    Number of Children: 1  . Years of Education: N/A   Occupational History  . Not on file.   Social History Main Topics  . Smoking status: Current Every Day Smoker -- 1.00 packs/day    Types: Cigarettes  . Smokeless tobacco: Never Used  . Alcohol Use: Yes     Comment: occasional  . Drug Use: No  . Sexual Activity: Yes    Birth Control/ Protection: Other-see comments     Comment: vasectomy   Other Topics Concern  . Not on file   Social History Narrative   Patient lives at home with her husband.    Medications:   Current Outpatient Prescriptions on File Prior to Visit  Medication Sig Dispense Refill  . CRESTOR 10 MG tablet Take 10 mg by mouth daily.       . Escitalopram Oxalate (LEXAPRO PO) Take 35 mg by mouth daily.      . Estradiol 0.52 MG/0.87 GM (0.06%) GEL Apply 1 application topically daily.  26 g  6  . LORazepam (ATIVAN) 0.5 MG tablet Take 0.5 mg by mouth every 6 (  six) hours as needed for anxiety.      . pantoprazole (PROTONIX) 40 MG tablet Take 40 mg by mouth daily.       . progesterone (PROMETRIUM) 200 MG capsule Take 1 at bedtime  30 capsule  6  . sertraline (ZOLOFT) 50 MG tablet       . zolpidem (AMBIEN) 10 MG tablet Take 10 mg by mouth at bedtime as needed.        No current facility-administered medications on file prior to visit.    Allergies:   Allergies  Allergen Reactions  . Wellbutrin [Bupropion] Other (See Comments)    Seizures and passed out    Physical Exam General: well developed, well nourished, seated, in no evident distress Head: head normocephalic and  atraumatic. Orohparynx benign Neck: supple with no carotid or supraclavicular bruits Cardiovascular: regular rate and rhythm, no murmurs Musculoskeletal: no deformity Skin:  no rash/petichiae. Allodynia in the right wrist and forearm. Soft tissue swelling over the right wrist with visible pulsations over both right radial and ulnar aspects. Bruit palpated over the right brachial artery Vascular:  Normal pulses all extremities. Palpable bruit over the right brachial artery. Visible pulsations over the right wrist radial and ulnar aspects  Neurologic Exam Mental Status: Awake and fully alert. Oriented to place and time. Recent and remote memory intact. Attention span, concentration and fund of knowledge appropriate. Mood and affect appropriate.  Cranial Nerves: Fundoscopic exam reveals sharp disc margins. Pupils equal, briskly reactive to light. Extraocular movements full without nystagmus. Visual fields full to confrontation. Hearing intact. Facial sensation intact. Face, tongue, palate moves normally and symmetrically.  Motor: Normal bulk and tone. Normal strength in all tested extremity muscles. Sensation : hyperesthesia  to tough and pinprick  Sensation in RUE more on median than ulnar aspect and vibratory sensation intact..  Coordination: Rapid alternating movements normal in all extremities. Finger-to-nose and heel-to-shin performed accurately bilaterally. Gait and Station: Arises from chair without difficulty. Stance is normal. Gait demonstrates normal stride length and balance . Able to heel, toe and tandem walk without difficulty.  Reflexes: 1+ and symmetric. Toes downgoing.       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 Right wrist swelling and abnormal pulsations of unclear etiology. Long-standing bilateral tinnitus with  negative brain imaging and ENT evaluation appears stable.    PLAN: I had a long discussion with the patient and her mother regarding her right wrist pulsatile swelling, discussed the differential diagnosis, plan for evaluation and treatment and answered questions. Trial of Topamax 50 mg twice daily for causalgia and neuropathy pain. Check EMG nerve conduction study for median nerve damage and MRI scan of the right wrist soft tissues to evaluate the pulsatile swelling. The patient was advised to not return to work till about evaluation is completed. Return for followup in 2 months or call earlier if necessary.   Note: This document was prepared with digital dictation and possible smart phrase technology. Any transcriptional errors that result from this process are unintentional.

## 2014-03-03 ENCOUNTER — Inpatient Hospital Stay: Admission: RE | Admit: 2014-03-03 | Payer: Self-pay | Source: Ambulatory Visit

## 2014-03-03 ENCOUNTER — Telehealth: Payer: Self-pay | Admitting: *Deleted

## 2014-03-05 ENCOUNTER — Ambulatory Visit (INDEPENDENT_AMBULATORY_CARE_PROVIDER_SITE_OTHER): Payer: BC Managed Care – PPO | Admitting: Neurology

## 2014-03-05 ENCOUNTER — Encounter (INDEPENDENT_AMBULATORY_CARE_PROVIDER_SITE_OTHER): Payer: BC Managed Care – PPO | Admitting: Radiology

## 2014-03-05 DIAGNOSIS — M79631 Pain in right forearm: Secondary | ICD-10-CM

## 2014-03-05 DIAGNOSIS — M25531 Pain in right wrist: Secondary | ICD-10-CM

## 2014-03-05 DIAGNOSIS — M79609 Pain in unspecified limb: Secondary | ICD-10-CM

## 2014-03-05 DIAGNOSIS — Z0289 Encounter for other administrative examinations: Secondary | ICD-10-CM

## 2014-03-05 MED ORDER — KETOROLAC TROMETHAMINE 30 MG/ML IJ SOLN: 30.0000 mg | Freq: Once | INTRAMUSCULAR | Status: DC

## 2014-03-05 MED ORDER — KETOROLAC TROMETHAMINE 30 MG/ML IJ SOLN
30.0000 mg | Freq: Once | INTRAMUSCULAR | Status: AC
  Administered 2014-03-05: 30 mg via INTRAMUSCULAR

## 2014-03-05 NOTE — Progress Notes (Addendum)
  [facility]  Provider:  Dr Jaynee Eagles Referring Provider: Rory Percy, MD Primary Care Physician:  Rory Percy, MD  CC: Numbness and tingling in the right forearm and hand   History:  Melanie Jimenez is a 53 y.o. female here as a referral from Dr. Nadara Mustard for pain and paresthesias in the right forearm and hand. Patient reports that 3 years ago she went to have blood drawn. When the needle was inserted into her arm, she felt fire shoot into her forearm. She has resultant severe burning and tingling in the ventral forearm. She denies weakness or paresthesias in the hand. However her wrist is very tender with pulsating nodules. She is not dropping objects and does not feel her grip is weak. Focused exam limited by pain but strength appears to be intact including in the intrinsic right hand muscles.   Summary: Nerve Conduction studies were performed on the right upper extremity.  Ulnar and Median motor conductions were within normal limits with normal F wave latencies.  Median, Ulnar and Radial sensory conductions were within normal limits.  The right Lateral Antebrachial Cutaneous Sensory and right Medial Antebrachial Cutaneous Sensory conductions were within normal limits.  The right median/ulnar (palm) comparison nerve conductions were within normal limits.   EMG needle evaluation was performed on selected right upper extremity muscles: The right Deltoid, right Triceps, right Flexor Digitorum Profundus (ulnar) and right Pronator Teres muscles were normal. EMG needle exam was terminated prematurely due to pain.  Conclusion: This is a limited study. EMG needle exam was terminated prematurely due to pain. However all nerve conductions and muscles tested were normal. No electrophysiologic evidence for neuropathy.    Sarina Ill, MD  [facility] [address] [place], [place] [address]  Phone [phone] Fax [phone]

## 2014-03-10 ENCOUNTER — Ambulatory Visit
Admission: RE | Admit: 2014-03-10 | Discharge: 2014-03-10 | Disposition: A | Discharge status: Home / Self Care | Payer: BC Managed Care – PPO | Source: Ambulatory Visit | Attending: Neurology | Admitting: Neurology

## 2014-03-10 ENCOUNTER — Telehealth: Payer: Self-pay | Admitting: *Deleted

## 2014-03-10 ENCOUNTER — Other Ambulatory Visit: Payer: Self-pay | Admitting: Neurology

## 2014-03-10 DIAGNOSIS — M25531 Pain in right wrist: Secondary | ICD-10-CM

## 2014-03-10 DIAGNOSIS — R202 Paresthesia of skin: Secondary | ICD-10-CM

## 2014-03-10 MED ORDER — GADOBENATE DIMEGLUMINE 529 MG/ML IV SOLN
7.0000 mL | Freq: Once | INTRAVENOUS | Status: AC | PRN
  Administered 2014-03-10: 7 mL via INTRAVENOUS

## 2014-03-10 NOTE — Telephone Encounter (Signed)
Please let her know a MRI of the forearm was added.

## 2014-03-10 NOTE — Telephone Encounter (Signed)
Pt calling asking about MRI of forearm as well as wrist due to pain and knot/s.  She is scheduled for 1245 at [facility] location. Call [phone]

## 2014-03-10 NOTE — Telephone Encounter (Signed)
I called pt and she is aware.  Melanie Jimenez in MRI working on authorization.

## 2014-03-13 ENCOUNTER — Telehealth: Payer: Self-pay | Admitting: Neurology

## 2014-03-13 NOTE — Telephone Encounter (Signed)
Pt called for MRI results.

## 2014-03-13 NOTE — Telephone Encounter (Signed)
Called patient and let her know her results are not ready.

## 2014-03-16 ENCOUNTER — Telehealth: Payer: Self-pay | Admitting: Neurology

## 2014-03-16 NOTE — Telephone Encounter (Signed)
Patient calling to state that she still hasn't heard anything regarding her MRI results, states that she was supposed to hear back today.

## 2014-03-17 NOTE — Telephone Encounter (Signed)
Patient calling back regarding MRI results.  Patient states arm is still hurting really badly.  Please call anytime and may leave detailed message on vm.

## 2014-03-18 ENCOUNTER — Other Ambulatory Visit: Payer: Self-pay | Admitting: *Deleted

## 2014-03-18 ENCOUNTER — Telehealth: Payer: Self-pay | Admitting: *Deleted

## 2014-03-18 DIAGNOSIS — T148XXA Other injury of unspecified body region, initial encounter: Secondary | ICD-10-CM

## 2014-03-18 NOTE — Telephone Encounter (Signed)
Message copied by Joellen Jersey on Wed Mar 18, 2014 12:59 PM ------      Message from: Jerolyn Center      Created: Wed Mar 18, 2014 11:56 AM      Regarding: Patient Call/Referral      Contact: [phone]       Patient Melanie Jimenez called for you regarding her Ortho Referral.  She said she was supposed to call with where she wants to go.  She wants to be referred to Dr. Onnie Graham at [facility].  She can be reached at [phone].            Thanks,      Lonn Georgia ------

## 2014-03-18 NOTE — Telephone Encounter (Signed)
Patient information has been sent to [facility]

## 2014-03-18 NOTE — Telephone Encounter (Signed)
Called patient and let her know her information has been sent to [facility].

## 2014-03-18 NOTE — Telephone Encounter (Signed)
Called patient gave her the results per Dr. Leonie Man, he is going to send a referral for a orthopedic surgeon..She had more questions and Dr. Leonie Man talked with her and answered all questions. Patient showed understanding

## 2014-03-26 ENCOUNTER — Other Ambulatory Visit: Payer: Self-pay | Admitting: Orthopedic Surgery

## 2014-03-26 DIAGNOSIS — M25531 Pain in right wrist: Secondary | ICD-10-CM

## 2014-04-02 ENCOUNTER — Ambulatory Visit
Admission: RE | Admit: 2014-04-02 | Discharge: 2014-04-02 | Disposition: A | Discharge status: Home / Self Care | Payer: BC Managed Care – PPO | Source: Ambulatory Visit | Attending: Orthopedic Surgery | Admitting: Orthopedic Surgery

## 2014-04-02 DIAGNOSIS — M25531 Pain in right wrist: Secondary | ICD-10-CM

## 2014-04-02 MED ORDER — IOHEXOL 350 MG/ML SOLN
80.0000 mL | Freq: Once | INTRAVENOUS | Status: AC | PRN
  Administered 2014-04-02: 80 mL via INTRAVENOUS

## 2014-04-08 ENCOUNTER — Telehealth: Payer: Self-pay | Admitting: Neurology

## 2014-04-08 NOTE — Telephone Encounter (Signed)
Patient is returning your call.  

## 2014-04-08 NOTE — Telephone Encounter (Signed)
Called patient to see if she was doing well lvm. Asking her to call me back.

## 2014-04-08 NOTE — Telephone Encounter (Signed)
Patient was sent to [facility]

## 2014-04-09 NOTE — Telephone Encounter (Signed)
I was calling the patient to check on her regarding her ortho appointment. And how she was doing. I will not be in the office Friday 04-10-2014 so please if she call get a update.

## 2014-05-04 ENCOUNTER — Encounter: Payer: Self-pay | Admitting: Neurology

## 2014-05-11 ENCOUNTER — Ambulatory Visit: Payer: BC Managed Care – PPO | Admitting: Neurology

## 2014-07-09 ENCOUNTER — Ambulatory Visit: Payer: BC Managed Care – PPO | Admitting: Neurology

## 2014-08-05 ENCOUNTER — Ambulatory Visit (INDEPENDENT_AMBULATORY_CARE_PROVIDER_SITE_OTHER): Payer: BLUE CROSS/BLUE SHIELD | Admitting: Neurology

## 2014-08-05 ENCOUNTER — Encounter: Payer: Self-pay | Admitting: Neurology

## 2014-08-05 VITALS — BP 134/82 | HR 78 | Ht 64.0 in | Wt 156.6 lb

## 2014-08-05 DIAGNOSIS — G5641 Causalgia of right upper limb: Secondary | ICD-10-CM

## 2014-08-05 MED ORDER — PREGABALIN 50 MG PO CAPS: 50.0000 mg | ORAL_CAPSULE | Freq: Two times a day (BID) | ORAL | Status: DC

## 2014-08-05 NOTE — Patient Instructions (Signed)
I had a long discussion with the patient with her chronic right hand neuropathic pain, personally reviewed MRI films, results, EMG nerve conduction study results with the patient and answered questions. Try Lyrica 50 mg at night for 1 week increase to twice daily and subsequently thereafter. I discussed possible side effects with the patient and advised her to call me if needed. I encouraged her to continue to wear wrist splint on her right hand as it seems to help her. I also counseled her to cut back the Ativan due to fear of dependency. Return for follow-up in 3 months or call earlier if necessary

## 2014-08-05 NOTE — Progress Notes (Signed)
[facility] [address] [place]. [place] [address] [phone]       OFFICE CONSULT NOTE  Ms. Melanie Jimenez Date of Birth:  02-15-1961 Medical Record Number:  [id number]   Referring MD: Melanie Jimenez  Reason for Referral:  Right hand pain and swelling  HPI: 31 year Caucasian lady who has been having right forearm and hand pain, numbness and tingling off and on following a blood draw from the right wrist with multiple failed attempts by phlebotomist in August 2011. She had immediate pain at that time. She underwent x-ray of the forearm and elbow on 02/03/2010 which I personally reviewed were unremarkable. Since July 22 this year she'll notice swelling and pain and pulsation over the ventral aspect of the right wrist. The pain is so severe that she can barely use her arm. She's also been complaining of generalized weakness and inability to work. She has had poor appetite and diarrhea and has lost some weight as well. She has not tried any specific medication for this pain. She was seen by vascular surgeon Dr. Adele Jimenez 01/23/14 who found the patient to have a palpable cord in the forearm and did an ultrasound of the radial and ulnar arteries which revealed no evidence of aneurysm with normal arterial waveforms. He felt the patient had a prominent ulnar artery which was a normal anatomical variant. Nonsteroidal anti-inflammatory medications recommended that the patient continues to have severe pain and burning. 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 Patient takes lorazepam for her tinnitus which seems to be unchanged. Update 08/05/2014 : She returns for follow-up after last visit 5 months ago. She  tried Topamax for a few weeks but it did not help and hence she discontinued it. She has been taking Ativan when necessary which helps the most. She has also been wearing the right lateral wrist splint which takes some of the edge of the pain off. She had no conduction EMG done by Dr. Lavell Jimenez on 03/05/14 which showed normal nerve conduction but study was limited as patient would not cooperate for the needle portion of the study due to pain. MRI scan of the soft tissue of the right wrist done on 03/10/14 films personally reviewed showed pulsatile swelling over the right wrist corresponding to radial and ulnar arteries and no clear vascular tumor or aneurysm was noted. Suggestion was made for evaluation for vasculitis There was suspicion for scaphoid few low lunate ligament tear in the right wrist. Patient was referred to orthopedics in [place] who evaluated her and felt the tear was not clinically significant enough to merit treatment at the present time.. The patient has no known history of rash, recurrent miscarriages, arthralgias or myalgias to raise concern for systemic vasculitis. ROS:   14 system review of systems is positive for ringing in the ears, restless legs, daytime sleepiness, neck stiffness, arm pain, numbness and discomfort. All other systems negative PMH:  Past Medical History  Diagnosis Date  . Depression   . GERD (gastroesophageal reflux disease)   . Tinnitus of both ears   . Hot flashes 10/15/2013  . Tinnitus 10/15/2013    Social History:  History   Social History  . Marital Status:  Married    Spouse Name: N/A    Number of Children: 1  . Years of Education: 12   Occupational History  . Not on file.   Social History Main Topics  . Smoking status: Current Every Day Smoker -- 1.00 packs/day    Types: Cigarettes  . Smokeless tobacco: Never Used  . Alcohol Use: 0.0 oz/week    0 Not specified per week     Comment: occasional  . Drug Use: No  . Sexual Activity: Yes     Birth Control/ Protection: Other-see comments     Comment: vasectomy   Other Topics Concern  . Not on file   Social History Narrative   Patient is married with one child.   Patient is right handed.   Patient has hs education.   Patient drinks 2-3 cups daily.    Medications:   Current Outpatient Prescriptions on File Prior to Visit  Medication Sig Dispense Refill  . CRESTOR 10 MG tablet Take 10 mg by mouth daily.     Marland Kitchen LORazepam (ATIVAN) 0.5 MG tablet Take 0.5 mg by mouth every 6 (six) hours as needed for anxiety.    Marland Kitchen zolpidem (AMBIEN) 10 MG tablet Take 10 mg by mouth at bedtime as needed.      No current facility-administered medications on file prior to visit.    Allergies:   Allergies  Allergen Reactions  . Wellbutrin [Bupropion] Other (See Comments)    Seizures and passed out    Physical Exam General: well developed, well nourished, seated, in no evident distress Head: head normocephalic and atraumatic. Orohparynx benign Neck: supple with no carotid or supraclavicular bruits Cardiovascular: regular rate and rhythm, no murmurs Musculoskeletal: no deformity Skin:  no rash/petichiae. Allodynia in the right wrist and forearm. Soft tissue swelling over the right wrist with visible pulsations over both right radial and ulnar aspects. Soft Bruit palpated over the right brachial artery Vascular:  Normal pulses all extremities. Palpable bruit over the right brachial artery. Visible pulsations over the right wrist radial and ulnar aspects which are compressible. Tender to touch  Neurologic Exam Mental Status: Awake and fully alert. Oriented to place and time. Recent and remote memory intact. Attention span, concentration and fund of knowledge appropriate. Mood and affect appropriate.  Cranial Nerves: Fundoscopic exam not done   Pupils equal, briskly reactive to light. Extraocular movements full without nystagmus. Visual fields full to confrontation. Hearing intact. Facial sensation  intact. Face, tongue, palate moves normally and symmetrically.  Motor: Normal bulk and tone. Normal strength in all tested extremity muscles. Sensation : hyperesthesia  to tough and pinprick  Sensation in RUE more on median than ulnar aspect and vibratory sensation intact..  Coordination: Rapid alternating movements normal in all extremities. Finger-to-nose and heel-to-shin performed accurately bilaterally. Gait and Station: Arises from chair without difficulty. Stance is normal. Gait demonstrates normal stride length and balance . Able to heel, toe and tandem walk without difficulty.  Reflexes: 1+ and symmetric. Toes downgoing.       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 Right wrist swelling and abnormal pulsations of unclear etiology.-MRI and ultrasound do not suggest vascular tumor or mass. Long-standing bilateral tinnitus with negative brain imaging and ENT evaluation appears stable.    PLAN: I had a long discussion with the patient with her chronic right hand neuropathic pain,  personally reviewed MRI films,  And results, EMG nerve conduction study results with the patient and answered questions. Try Lyrica 50 mg at night for 1 week increase to twice daily and subsequently thereafter. I discussed possible side effects with the patient and advised her to call me if needed. I encouraged her to continue to wear wrist splint on her right hand as it seems to help her. I also counseled her to cut back the Ativan due to fear of dependency. Check lab work for vasculitis though she lacks clinical history to suggest that .Return for follow-up in 3 months or call earlier if necessary  Note: This document was prepared with digital dictation and possible smart phrase technology. Any transcriptional errors that result from this process are  unintentional.

## 2014-11-04 ENCOUNTER — Other Ambulatory Visit: Payer: BLUE CROSS/BLUE SHIELD | Admitting: Adult Health

## 2014-11-05 ENCOUNTER — Other Ambulatory Visit: Payer: BLUE CROSS/BLUE SHIELD | Admitting: Adult Health

## 2014-11-11 ENCOUNTER — Telehealth: Payer: Self-pay | Admitting: Neurology

## 2014-11-11 NOTE — Telephone Encounter (Signed)
Okay to be seen only as needed but she needs follow-up CT angiogram for aneurysm every 2 years and earlier if she has headache or neurological symptoms.

## 2014-11-11 NOTE — Telephone Encounter (Signed)
Spoke with patient re: is it necessary for her to reschedule FU. She states she only took Lyrica x 1 month after Feb 2016 appt with Dr Leonie Man. She states she did not feel any benefit from Lyrica, so she stopped taking it. She states in the past 6 weeks she has had no problems at all with her right hand. She is currently under treatment for kidney stones. She states she does not want to reschedule if not necessary. She does state that an MRI of her brain from 10/2013 "showed a very small aneurysm". She states that if Dr Tilden Dome feels she needs to come in for that reason, she will reschedule. Otherwise she requests to come in on as needed basis. Informed Ms Raker will route her questions to Dr Leonie Man for his response. Informed her that if she does not hear back from this RN by end of day tomorrow she knows that she does not need to reschedule, but that she may call at any time for questions, concerns, needs.  Ms Poirier verbalized understanding, agreement with above plan.

## 2014-11-11 NOTE — Telephone Encounter (Signed)
Patient called to cancel appt. (5/25) today, she requested to speak with Verneita Griffes RN regarding rescheduling a new appt. The patient would like to know if it is necessary to be seen again before she reschedules. Please call and advise.

## 2014-11-11 NOTE — Telephone Encounter (Signed)
Spoke with Ms Labreck and informed her in detail of Dr Clydene Fake response. She states she will mark her calendar to call this office March 2017 for appointment, re: CT angiogram. She verbalized appreciation for this call.

## 2014-11-17 ENCOUNTER — Ambulatory Visit: Payer: Self-pay | Admitting: Surgery

## 2014-11-17 NOTE — H&P (Signed)
History of Present Illness Melanie Jimenez. Melanie Strang MD; 11/17/2014 4:16 PM) Patient words: abd mass.  The patient is a 54 year old female who presents with an abdominal mass. Referred by Dr. Rory Percy for right lower quadrant abdominal mass. This is a 54 year old female who presents after recent workup for hematuria. She was discovered to have multiple kidney stones on noncontrasted CT scan. She has an incidental finding of a 1.6 x 1.7 cm mass in the anterior intra-peritoneal right lower quadrant of uncertain etiology. The patient reports chronic constipation with bowel movements 1-2 times per week. No changes recently. No melena or hematochezia. She has no pain associated with the mass, but does have flank and back pain from the kidney stones. Other Problems Elbert Ewings, CMA; 11/17/2014 2:19 PM) Anxiety Disorder Back Pain Depression Gastroesophageal Reflux Disease Hypercholesterolemia Kidney Stone  Past Surgical History Elbert Ewings, CMA; 11/17/2014 2:19 PM) Cesarean Section - 1 Oral Surgery Tonsillectomy  Diagnostic Studies History Elbert Ewings, CMA; 11/17/2014 2:19 PM) Colonoscopy never Mammogram 1-3 years ago Pap Smear 1-5 years ago  Allergies Elbert Ewings, CMA; 11/17/2014 2:20 PM) Wellbutrin *ANTIDEPRESSANTS*  Medication History Elbert Ewings, CMA; 11/17/2014 2:20 PM) Crestor (10MG  Tablet, Oral) Active. Sertraline HCl (100MG  Tablet, Oral) Active. LORazepam (0.5MG  Tablet, Oral) Active. Zolpidem Tartrate (10MG  Tablet, Oral) Active. Flomax (0.4MG  Capsule, Oral daily) Active. Medications Reconciled  Social History Elbert Ewings, [place]; 11/17/2014 2:19 PM) Alcohol use Occasional alcohol use. Caffeine use Carbonated beverages. No drug use Tobacco use Current every day smoker.  Family History Elbert Ewings, [place]; 11/17/2014 2:19 PM) Diabetes Mellitus Father, Mother.  Pregnancy / Birth History Elbert Ewings, CMA; 11/17/2014 2:19 PM) Age at menarche 22 years. Age  of menopause [age] Contraceptive History Oral contraceptives. Gravida 1 Maternal age 17-25 Para 1     Review of Systems Elbert Ewings CMA; 11/17/2014 2:19 PM) General Present- Night Sweats and Weight Gain. Not Present- Appetite Loss, Chills, Fatigue, Fever and Weight Loss. Skin Not Present- Change in Wart/Mole, Dryness, Hives, Jaundice, New Lesions, Non-Healing Wounds, Rash and Ulcer. HEENT Present- Ringing in the Ears. Not Present- Earache, Hearing Loss, Hoarseness, Nose Bleed, Oral Ulcers, Seasonal Allergies, Sinus Pain, Sore Throat, Visual Disturbances, Wears glasses/contact lenses and Yellow Eyes. Respiratory Not Present- Bloody sputum, Chronic Cough, Difficulty Breathing, Snoring and Wheezing. Breast Not Present- Breast Mass, Breast Pain, Nipple Discharge and Skin Changes. Cardiovascular Present- Rapid Heart Rate. Not Present- Chest Pain, Difficulty Breathing Lying Down, Leg Cramps, Palpitations, Shortness of Breath and Swelling of Extremities. Gastrointestinal Not Present- Abdominal Pain, Bloating, Bloody Stool, Change in Bowel Habits, Chronic diarrhea, Constipation, Difficulty Swallowing, Excessive gas, Gets full quickly at meals, Hemorrhoids, Indigestion, Nausea, Rectal Pain and Vomiting. Female Genitourinary Present- Frequency and Urgency. Not Present- Nocturia, Painful Urination and Pelvic Pain. Musculoskeletal Present- Back Pain. Not Present- Joint Pain, Joint Stiffness, Muscle Pain, Muscle Weakness and Swelling of Extremities. Neurological Present- Weakness. Not Present- Decreased Memory, Fainting, Headaches, Numbness, Seizures, Tingling, Tremor and Trouble walking. Psychiatric Present- Anxiety and Depression. Not Present- Bipolar, Change in Sleep Pattern, Fearful and Frequent crying. Endocrine Present- Excessive Hunger, Hair Changes, Heat Intolerance and Hot flashes. Not Present- Cold Intolerance and New Diabetes. Hematology Not Present- Easy Bruising, Excessive bleeding, Gland  problems, HIV and Persistent Infections.  Vitals Elbert Ewings CMA; 11/17/2014 2:21 PM) 11/17/2014 2:21 PM Weight: 158 lb Height: 64in Body Surface Area: 1.8 m Body Mass Index: 27.12 kg/m Temp.: 98.46F(Oral)  Pulse: 97 (Regular)  Resp.: 18 (Unlabored)  BP: 128/72 (Sitting, Left Arm, Standard)  Physical Exam Rodman Key K. Makailah Slavick MD; 11/17/2014 4:16 PM)  The physical exam findings are as follows: Note:WDWN in NAD HEENT: EOMI, sclera anicteric Neck: No masses, no thyromegaly Lungs: CTA bilaterally; normal respiratory effort CV: Regular rate and rhythm; no murmurs Abd: +bowel sounds, soft, non-tender, no masses Ext: Well-perfused; no edema Skin: Warm, dry; no sign of jaundice    Assessment & Plan Rodman Key K. Thera Basden MD; 11/17/2014 4:18 PM)  RIGHT LOWER QUADRANT ABDOMINAL MASS (789.33  R19.03)  Current Plans I cannot determine the etiology of the RLQ calcified mass, but it seems to be in the area of the cecum/ appendix. The appendix is not easily identified on the scan because of the lack of contrast. This may represent a mass growing at the tip of the appendix. Her options are 6 month follow-up with repeat CT scan or laparoscopic removal of the mass. She wants to go ahead and have this removed as soon as possible.  Schedule for Surgery - Laparoscopic appendectomy/ removal of abdominal mass. The surgical procedure has been discussed with the patient. Potential risks, benefits, alternative treatments, and expected outcomes have been explained. All of the patient's questions at this time have been answered. The likelihood of reaching the patient's treatment goal is good. The patient understand the proposed surgical procedure and wishes to proceed.   Melanie Jimenez. Georgette Dover, MD, [facility]  General/ Trauma Surgery  11/17/2014 4:18 PM

## 2014-11-23 ENCOUNTER — Encounter (HOSPITAL_COMMUNITY)
Admission: RE | Admit: 2014-11-23 | Discharge: 2014-11-23 | Disposition: A | Discharge status: Home / Self Care | Payer: BLUE CROSS/BLUE SHIELD | Source: Ambulatory Visit | Attending: Surgery | Admitting: Surgery

## 2014-11-23 DIAGNOSIS — Z87442 Personal history of urinary calculi: Secondary | ICD-10-CM | POA: Diagnosis not present

## 2014-11-23 DIAGNOSIS — Z888 Allergy status to other drugs, medicaments and biological substances status: Secondary | ICD-10-CM | POA: Diagnosis not present

## 2014-11-23 DIAGNOSIS — Z79899 Other long term (current) drug therapy: Secondary | ICD-10-CM | POA: Diagnosis not present

## 2014-11-23 DIAGNOSIS — K219 Gastro-esophageal reflux disease without esophagitis: Secondary | ICD-10-CM | POA: Diagnosis not present

## 2014-11-23 DIAGNOSIS — F419 Anxiety disorder, unspecified: Secondary | ICD-10-CM | POA: Diagnosis not present

## 2014-11-23 DIAGNOSIS — F17210 Nicotine dependence, cigarettes, uncomplicated: Secondary | ICD-10-CM | POA: Diagnosis not present

## 2014-11-23 DIAGNOSIS — R1903 Right lower quadrant abdominal swelling, mass and lump: Secondary | ICD-10-CM | POA: Diagnosis present

## 2014-11-23 DIAGNOSIS — M549 Dorsalgia, unspecified: Secondary | ICD-10-CM | POA: Diagnosis not present

## 2014-11-23 DIAGNOSIS — F329 Major depressive disorder, single episode, unspecified: Secondary | ICD-10-CM | POA: Diagnosis not present

## 2014-11-23 DIAGNOSIS — K388 Other specified diseases of appendix: Secondary | ICD-10-CM | POA: Diagnosis not present

## 2014-11-23 DIAGNOSIS — E780 Pure hypercholesterolemia: Secondary | ICD-10-CM | POA: Diagnosis not present

## 2014-11-23 LAB — BASIC METABOLIC PANEL
ANION GAP: 9 (ref 5–15)
BUN: 19 mg/dL (ref 6–20)
CALCIUM: 9.6 mg/dL (ref 8.9–10.3)
CHLORIDE: 105 mmol/L (ref 101–111)
CO2: 23 mmol/L (ref 22–32)
CREATININE: 0.5 mg/dL (ref 0.44–1.00)
GFR calc non Af Amer: >60 mL/min (ref >60)
Glucose, Bld: 93 mg/dL (ref 65–99)
Potassium: 4.3 mmol/L (ref 3.5–5.1)
SODIUM: 137 mmol/L (ref 135–145)

## 2014-11-23 LAB — CBC
HEMATOCRIT: 40.9 % (ref 36.0–46.0)
Hemoglobin: 14.4 g/dL (ref 12.0–15.0)
MCH: 33.2 pg (ref 26.0–34.0)
MCHC: 35.2 g/dL (ref 30.0–36.0)
MCV: 94.2 fL (ref 78.0–100.0)
Platelets: 237 10*3/uL (ref 150–400)
RBC: 4.34 MIL/uL (ref 3.87–5.11)
RDW: 12 % (ref 11.5–15.5)
WBC: 7.4 10*3/uL (ref 4.0–10.5)

## 2014-11-23 MED ORDER — CHLORHEXIDINE GLUCONATE 4 % EX LIQD: 1.0000 "application " | Freq: Once | CUTANEOUS | Status: DC

## 2014-11-23 NOTE — Pre-Procedure Instructions (Signed)
    Melanie Jimenez  11/23/2014      [facility], [place] [address] Phone: [phone] Fax: [phone]    Your procedure is scheduled on Nov 25, 2014.  Report to [facility] Admitting at 6:30 A.M.  Call this number if you have problems the morning of surgery:  [phone]   Remember:  Do not eat food or drink liquids after midnight Tuesday MAY 24.  Take these medicines the morning of surgery with A SIP OF WATER: sertraline (ZOLOFT),  tamsulosin (FLOMAX);   IF NEEDED: LORazepam (ATIVAN)   Do not wear jewelry, make-up or nail polish.  Do not wear lotions, powders, or perfumes.  You may wear deodorant.  Do not shave 48 hours prior to surgery.    Do not bring valuables to the hospital.  [facility] is not responsible for any belongings or valuables.  Contacts, dentures or bridgework may not be worn into surgery.  Leave your suitcase in the car.  After surgery it may be brought to your room.  For patients admitted to the hospital, discharge time will be determined by your treatment team.  Patients discharged the day of surgery will not be allowed to drive home.   Name and phone number of your driver:   FAMILY/FRIEND Special instructions:  PREPARING FOR SURGERY  Please read over the following fact sheets that you were given. Pain Booklet, Coughing and Deep Breathing and Surgical Site Infection Prevention

## 2014-11-24 MED ORDER — DEXTROSE 5 % IV SOLN
2.0000 g | INTRAVENOUS | Status: DC
  Filled 2014-11-24: qty 2

## 2014-11-25 ENCOUNTER — Encounter (HOSPITAL_COMMUNITY): Payer: Self-pay | Admitting: *Deleted

## 2014-11-25 ENCOUNTER — Ambulatory Visit: Payer: BLUE CROSS/BLUE SHIELD | Admitting: Neurology

## 2014-11-25 ENCOUNTER — Ambulatory Visit (HOSPITAL_COMMUNITY): Payer: BLUE CROSS/BLUE SHIELD | Admitting: Anesthesiology

## 2014-11-25 ENCOUNTER — Encounter (HOSPITAL_COMMUNITY)
Admission: RE | Disposition: A | Discharge status: Home / Self Care | Payer: Self-pay | Source: Ambulatory Visit | Attending: Surgery

## 2014-11-25 ENCOUNTER — Ambulatory Visit (HOSPITAL_COMMUNITY)
Admission: RE | Admit: 2014-11-25 | Discharge: 2014-11-25 | Disposition: A | Discharge status: Home / Self Care | Payer: BLUE CROSS/BLUE SHIELD | Source: Ambulatory Visit | Attending: Surgery | Admitting: Surgery

## 2014-11-25 DIAGNOSIS — F329 Major depressive disorder, single episode, unspecified: Secondary | ICD-10-CM | POA: Insufficient documentation

## 2014-11-25 DIAGNOSIS — Z888 Allergy status to other drugs, medicaments and biological substances status: Secondary | ICD-10-CM | POA: Insufficient documentation

## 2014-11-25 DIAGNOSIS — F419 Anxiety disorder, unspecified: Secondary | ICD-10-CM | POA: Insufficient documentation

## 2014-11-25 DIAGNOSIS — F17210 Nicotine dependence, cigarettes, uncomplicated: Secondary | ICD-10-CM | POA: Insufficient documentation

## 2014-11-25 DIAGNOSIS — M549 Dorsalgia, unspecified: Secondary | ICD-10-CM | POA: Insufficient documentation

## 2014-11-25 DIAGNOSIS — E780 Pure hypercholesterolemia: Secondary | ICD-10-CM | POA: Insufficient documentation

## 2014-11-25 DIAGNOSIS — Z87442 Personal history of urinary calculi: Secondary | ICD-10-CM | POA: Insufficient documentation

## 2014-11-25 DIAGNOSIS — K388 Other specified diseases of appendix: Secondary | ICD-10-CM | POA: Insufficient documentation

## 2014-11-25 DIAGNOSIS — Z79899 Other long term (current) drug therapy: Secondary | ICD-10-CM | POA: Insufficient documentation

## 2014-11-25 DIAGNOSIS — K219 Gastro-esophageal reflux disease without esophagitis: Secondary | ICD-10-CM | POA: Insufficient documentation

## 2014-11-25 HISTORY — PX: LAPAROSCOPIC APPENDECTOMY: SHX408

## 2014-11-25 SURGERY — APPENDECTOMY, LAPAROSCOPIC
Anesthesia: General | Site: Abdomen

## 2014-11-25 MED ORDER — PROPOFOL 10 MG/ML IV BOLUS
INTRAVENOUS | Status: AC
  Filled 2014-11-25: qty 20

## 2014-11-25 MED ORDER — SODIUM CHLORIDE 0.9 % IR SOLN
Status: DC | PRN
  Administered 2014-11-25: 1000 mL

## 2014-11-25 MED ORDER — DEXAMETHASONE SODIUM PHOSPHATE 4 MG/ML IJ SOLN
INTRAMUSCULAR | Status: DC | PRN
  Administered 2014-11-25: 8 mg via INTRAVENOUS

## 2014-11-25 MED ORDER — PROPOFOL 10 MG/ML IV BOLUS
INTRAVENOUS | Status: DC | PRN
  Administered 2014-11-25: 130 mg via INTRAVENOUS

## 2014-11-25 MED ORDER — ROCURONIUM BROMIDE 50 MG/5ML IV SOLN
INTRAVENOUS | Status: AC
  Filled 2014-11-25: qty 1

## 2014-11-25 MED ORDER — ONDANSETRON HCL 4 MG/2ML IJ SOLN
INTRAMUSCULAR | Status: AC
  Filled 2014-11-25: qty 2

## 2014-11-25 MED ORDER — KETOROLAC TROMETHAMINE 30 MG/ML IJ SOLN
INTRAMUSCULAR | Status: AC
  Filled 2014-11-25: qty 1

## 2014-11-25 MED ORDER — FENTANYL CITRATE (PF) 250 MCG/5ML IJ SOLN
INTRAMUSCULAR | Status: AC
  Filled 2014-11-25: qty 5

## 2014-11-25 MED ORDER — BUPIVACAINE-EPINEPHRINE 0.25% -1:200000 IJ SOLN
INTRAMUSCULAR | Status: DC | PRN
  Administered 2014-11-25: 10 mL

## 2014-11-25 MED ORDER — OXYCODONE-ACETAMINOPHEN 5-325 MG PO TABS: 1.0000 | ORAL_TABLET | ORAL | Status: DC | PRN

## 2014-11-25 MED ORDER — MORPHINE SULFATE 2 MG/ML IJ SOLN: 2.0000 mg | INTRAMUSCULAR | Status: DC | PRN

## 2014-11-25 MED ORDER — HYDROMORPHONE HCL 1 MG/ML IJ SOLN
0.2500 mg | INTRAMUSCULAR | Status: DC | PRN
  Administered 2014-11-25 (×2): 0.5 mg via INTRAVENOUS

## 2014-11-25 MED ORDER — ONDANSETRON HCL 4 MG/2ML IJ SOLN
INTRAMUSCULAR | Status: DC | PRN
  Administered 2014-11-25: 4 mg via INTRAVENOUS

## 2014-11-25 MED ORDER — DEXAMETHASONE SODIUM PHOSPHATE 4 MG/ML IJ SOLN
INTRAMUSCULAR | Status: AC
  Filled 2014-11-25: qty 2

## 2014-11-25 MED ORDER — LACTATED RINGERS IV SOLN
INTRAVENOUS | Status: DC | PRN
  Administered 2014-11-25: 08:00:00 via INTRAVENOUS

## 2014-11-25 MED ORDER — ROCURONIUM BROMIDE 100 MG/10ML IV SOLN
INTRAVENOUS | Status: DC | PRN
  Administered 2014-11-25: 40 mg via INTRAVENOUS

## 2014-11-25 MED ORDER — KETOROLAC TROMETHAMINE 30 MG/ML IJ SOLN
INTRAMUSCULAR | Status: DC | PRN
  Administered 2014-11-25: 30 mg via INTRAVENOUS

## 2014-11-25 MED ORDER — LIDOCAINE HCL (CARDIAC) 20 MG/ML IV SOLN
INTRAVENOUS | Status: AC
Start: 2014-11-25 — End: 2014-11-25
  Filled 2014-11-25: qty 5

## 2014-11-25 MED ORDER — MIDAZOLAM HCL 2 MG/2ML IJ SOLN
INTRAMUSCULAR | Status: AC
  Filled 2014-11-25: qty 2

## 2014-11-25 MED ORDER — MIDAZOLAM HCL 5 MG/5ML IJ SOLN
INTRAMUSCULAR | Status: DC | PRN
  Administered 2014-11-25: 2 mg via INTRAVENOUS

## 2014-11-25 MED ORDER — BUPIVACAINE-EPINEPHRINE (PF) 0.25% -1:200000 IJ SOLN
INTRAMUSCULAR | Status: AC
  Filled 2014-11-25: qty 30

## 2014-11-25 MED ORDER — LIDOCAINE HCL (CARDIAC) 20 MG/ML IV SOLN
INTRAVENOUS | Status: DC | PRN
Start: 2014-11-25 — End: 2014-11-25
  Administered 2014-11-25: 40 mg via INTRAVENOUS

## 2014-11-25 MED ORDER — ONDANSETRON HCL 4 MG/2ML IJ SOLN: 4.0000 mg | INTRAMUSCULAR | Status: DC | PRN

## 2014-11-25 MED ORDER — GLYCOPYRROLATE 0.2 MG/ML IJ SOLN
INTRAMUSCULAR | Status: DC | PRN
  Administered 2014-11-25: .8 mg via INTRAVENOUS

## 2014-11-25 MED ORDER — HYDROMORPHONE HCL 1 MG/ML IJ SOLN
INTRAMUSCULAR | Status: AC
  Administered 2014-11-25: 0.5 mg via INTRAVENOUS
  Filled 2014-11-25: qty 1

## 2014-11-25 MED ORDER — FENTANYL CITRATE (PF) 100 MCG/2ML IJ SOLN
INTRAMUSCULAR | Status: DC | PRN
  Administered 2014-11-25: 100 ug via INTRAVENOUS
  Administered 2014-11-25 (×2): 50 ug via INTRAVENOUS

## 2014-11-25 MED ORDER — NEOSTIGMINE METHYLSULFATE 10 MG/10ML IV SOLN
INTRAVENOUS | Status: DC | PRN
  Administered 2014-11-25: 5 mg via INTRAVENOUS

## 2014-11-25 MED ORDER — 0.9 % SODIUM CHLORIDE (POUR BTL) OPTIME
TOPICAL | Status: DC | PRN
  Administered 2014-11-25: 1000 mL

## 2014-11-25 SURGICAL SUPPLY — 48 items
APPLIER CLIP ROT 10 11.4 M/L (STAPLE)
BENZOIN TINCTURE PRP APPL 2/3 (GAUZE/BANDAGES/DRESSINGS) ×3 IMPLANT
BLADE SURG ROTATE 9660 (MISCELLANEOUS) IMPLANT
CANISTER SUCTION 2500CC (MISCELLANEOUS) ×3 IMPLANT
CHLORAPREP W/TINT 26ML (MISCELLANEOUS) ×3 IMPLANT
CLIP APPLIE ROT 10 11.4 M/L (STAPLE) IMPLANT
CLOSURE WOUND 1/2 X4 (GAUZE/BANDAGES/DRESSINGS) ×1
COVER SURGICAL LIGHT HANDLE (MISCELLANEOUS) ×3 IMPLANT
CUTTER FLEX LINEAR 45M (STAPLE) ×3 IMPLANT
DRAPE LAPAROSCOPIC ABDOMINAL (DRAPES) ×3 IMPLANT
DRSG TEGADERM 2-3/8X2-3/4 SM (GAUZE/BANDAGES/DRESSINGS) ×6 IMPLANT
DRSG TEGADERM 4X4.75 (GAUZE/BANDAGES/DRESSINGS) ×3 IMPLANT
ELECT REM PT RETURN 9FT ADLT (ELECTROSURGICAL) ×3
ELECTRODE REM PT RTRN 9FT ADLT (ELECTROSURGICAL) ×1 IMPLANT
ENDOLOOP SUT PDS II  0 18 (SUTURE)
ENDOLOOP SUT PDS II 0 18 (SUTURE) IMPLANT
FILTER SMOKE EVAC LAPAROSHD (FILTER) IMPLANT
GAUZE SPONGE 2X2 8PLY STRL LF (GAUZE/BANDAGES/DRESSINGS) ×1 IMPLANT
GLOVE BIO SURGEON STRL SZ7 (GLOVE) ×6 IMPLANT
GLOVE BIOGEL PI IND STRL 6 (GLOVE) ×1 IMPLANT
GLOVE BIOGEL PI IND STRL 6.5 (GLOVE) ×1 IMPLANT
GLOVE BIOGEL PI IND STRL 7.0 (GLOVE) ×1 IMPLANT
GLOVE BIOGEL PI IND STRL 7.5 (GLOVE) ×1 IMPLANT
GLOVE BIOGEL PI INDICATOR 6 (GLOVE) ×2
GLOVE BIOGEL PI INDICATOR 6.5 (GLOVE) ×2
GLOVE BIOGEL PI INDICATOR 7.0 (GLOVE) ×2
GLOVE BIOGEL PI INDICATOR 7.5 (GLOVE) ×2
GLOVE SURG SS PI 6.5 STRL IVOR (GLOVE) ×3 IMPLANT
GOWN STRL REUS W/ TWL LRG LVL3 (GOWN DISPOSABLE) ×3 IMPLANT
GOWN STRL REUS W/TWL LRG LVL3 (GOWN DISPOSABLE) ×6
KIT BASIN OR (CUSTOM PROCEDURE TRAY) ×3 IMPLANT
KIT ROOM TURNOVER OR (KITS) ×3 IMPLANT
NS IRRIG 1000ML POUR BTL (IV SOLUTION) ×3 IMPLANT
PAD ARMBOARD 7.5X6 YLW CONV (MISCELLANEOUS) ×6 IMPLANT
POUCH SPECIMEN RETRIEVAL 10MM (ENDOMECHANICALS) ×6 IMPLANT
RELOAD STAPLE TA45 3.5 REG BLU (ENDOMECHANICALS) ×3 IMPLANT
SCALPEL HARMONIC ACE (MISCELLANEOUS) ×3 IMPLANT
SET IRRIG TUBING LAPAROSCOPIC (IRRIGATION / IRRIGATOR) ×3 IMPLANT
SPECIMEN JAR SMALL (MISCELLANEOUS) ×6 IMPLANT
SPONGE GAUZE 2X2 STER 10/PKG (GAUZE/BANDAGES/DRESSINGS) ×2
STRIP CLOSURE SKIN 1/2X4 (GAUZE/BANDAGES/DRESSINGS) ×2 IMPLANT
SUT MNCRL AB 4-0 PS2 18 (SUTURE) ×3 IMPLANT
TOWEL OR 17X24 6PK STRL BLUE (TOWEL DISPOSABLE) ×3 IMPLANT
TOWEL OR 17X26 10 PK STRL BLUE (TOWEL DISPOSABLE) IMPLANT
TRAY LAPAROSCOPIC (CUSTOM PROCEDURE TRAY) ×3 IMPLANT
TROCAR XCEL BLADELESS 5X75MML (TROCAR) ×6 IMPLANT
TROCAR XCEL BLUNT TIP 100MML (ENDOMECHANICALS) ×3 IMPLANT
TUBING INSUFFLATION (TUBING) ×3 IMPLANT

## 2014-11-25 NOTE — Anesthesia Preprocedure Evaluation (Addendum)
Anesthesia Evaluation  Patient identified by MRN, date of birth, ID band Patient awake    Reviewed: Allergy & Precautions, H&P , NPO status , Patient's Chart, lab work & pertinent test results  Airway Mallampati: II  TM Distance: >3 FB Neck ROM: Full    Dental no notable dental hx. (+) Teeth Intact, Dental Advisory Given   Pulmonary Current Smoker,  breath sounds clear to auscultation  Pulmonary exam normal       Cardiovascular negative cardio ROS  Rhythm:Regular Rate:Normal     Neuro/Psych  Headaches, Anxiety Depression    GI/Hepatic Neg liver ROS, GERD-  Controlled,  Endo/Other  negative endocrine ROS  Renal/GU negative Renal ROS  negative genitourinary   Musculoskeletal   Abdominal   Peds  Hematology negative hematology ROS (+)   Anesthesia Other Findings   Reproductive/Obstetrics negative OB ROS                           Anesthesia Physical Anesthesia Plan  ASA: II  Anesthesia Plan: General   Post-op Pain Management:    Induction: Intravenous  Airway Management Planned: Oral ETT  Additional Equipment:   Intra-op Plan:   Post-operative Plan: Extubation in OR  Informed Consent: I have reviewed the patients History and Physical, chart, labs and discussed the procedure including the risks, benefits and alternatives for the proposed anesthesia with the patient or authorized representative who has indicated his/her understanding and acceptance.   Dental advisory given  Plan Discussed with: CRNA  Anesthesia Plan Comments:        Anesthesia Quick Evaluation

## 2014-11-25 NOTE — Discharge Instructions (Signed)
[facility] °LAPAROSCOPIC SURGERY: POST OP INSTRUCTIONS °Always review your discharge instruction sheet given to you by the facility where your surgery was performed. °IF YOU HAVE DISABILITY OR FAMILY LEAVE FORMS, YOU MUST BRING THEM TO THE OFFICE FOR PROCESSING.   °DO NOT GIVE THEM TO YOUR DOCTOR. ° °1. A prescription for pain medication will be given to you upon discharge.  Take your pain medication as prescribed, if needed.  If narcotic pain medicine is not needed, then you may take acetaminophen (Tylenol) or ibuprofen (Advil) as needed. °2. Take your usually prescribed medications unless otherwise directed. °3. If you need a refill on your pain medication, please contact your pharmacy.  They will contact our office to request authorization. Prescriptions will not be filled after 5pm or on week-ends. °4. You should follow a light diet the first few days after arrival home, such as soup and crackers, etc.  Be sure to include lots of fluids daily. °5. Most patients will experience some swelling and bruising in the area of the incisions.  Ice packs will help.  Swelling and bruising can take several days to resolve.  °6. It is common to experience some constipation if taking pain medication after surgery.  Increasing fluid intake and taking a stool softener (such as Colace) will usually help or prevent this problem from occurring.  A mild laxative (Milk of Magnesia or Miralax) should be taken according to package instructions if there are no bowel movements after 48 hours. °7. Unless discharge instructions indicate otherwise, you may remove your bandages 48 hours after surgery, and you may shower at that time.  You will have steri-strips (small skin tapes) in place directly over the incision.  These strips should be left on the skin for 7-10 days.  If your surgeon used skin glue on the incision, you may shower in 24 hours.  The glue will flake off over the next 2-3 weeks.  Any sutures or staples  will be removed at the office during your follow-up visit. °8. ACTIVITIES:  You may resume regular (light) daily activities beginning the next day--such as daily self-care, walking, climbing stairs--gradually increasing activities as tolerated.  You may have sexual intercourse when it is comfortable.  Refrain from any heavy lifting or straining until approved by your doctor. °a. You may drive when you are no longer taking prescription pain medication, you can comfortably wear a seatbelt, and you can safely maneuver your car and apply brakes. °b. RETURN TO WORK:   2-3 weeks °9. You should see your doctor in the office for a follow-up appointment approximately 2-3 weeks after your surgery.  Make sure that you call for this appointment within a day or two after you arrive home to insure a convenient appointment time. °10. OTHER INSTRUCTIONS: ________________________________________________________________________ °WHEN TO CALL YOUR DOCTOR: °1. Fever over 101.0 °2. Inability to urinate °3. Continued bleeding from incision. °4. Increased pain, redness, or drainage from the incision. °5. Increasing abdominal pain ° °The clinic staff is available to answer your questions during regular business hours.  Please don’t hesitate to call and ask to speak to one of the nurses for clinical concerns.  If you have a medical emergency, go to the nearest emergency room or call 911.  A surgeon from [facility] is always on call at the hospital. °[address], [address], [place], [place]  [address] ? [address] [address], [place], [place]   [address] °[phone] ? [phone] ? FAX [phone] °Web site:   [email or web address] ° °

## 2014-11-25 NOTE — Op Note (Signed)
Laparoscopic appendectomy, excision of abdominal mass Procedure Note  Indications: The patient presented with a history of kidney stones. A non-contrast CT scan revealed a 1.7 cm calcified mass in the right lower quadrant, but the appendix could not be clearly identified.  We recommended laparoscopic appendectomy and excision of the calcified mass for diagnosis.  Pre-operative Diagnosis: Right lower quadrant calcified intra-abdominal mass.  Post-operative Diagnosis: Same  Surgeon: Donnie Mesa K.   Assistants: none  Anesthesia: General endotracheal anesthesia  ASA Class: 1  Procedure Details  The patient was seen again in the Holding Room. The risks, benefits, complications, treatment options, and expected outcomes were discussed with the patient and/or family. The possibilities of reaction to medication, perforation of viscus, bleeding, recurrent infection, finding a normal appendix, the need for additional procedures, failure to diagnose a condition, and creating a complication requiring transfusion or operation were discussed. There was concurrence with the proposed plan and informed consent was obtained. The site of surgery was properly noted. The patient was taken to Operating Room, identified as Colman Cater and the procedure verified as Appendectomy. A Time Out was held and the above information confirmed.  The patient was placed in the supine position and general anesthesia was induced.  The abdomen was prepped and draped in a sterile fashion. A one centimeter infraumbilical incision was made.  Dissection was carried down to the fascia bluntly.  The fascia was incised vertically.  We entered the peritoneal cavity bluntly.  A pursestring suture was passed around the incision with a 0 Vicryl.  The Hasson cannula was introduced into the abdomen and the tails of the suture were used to hold the Hasson in place.   The pneumoperitoneum was then established maintaining a maximum pressure of 15  mmHg.  Additional 5 mm cannulas then placed in the left lower quadrant of the abdomen and the right upper quadrant under direct visualization. A careful evaluation of the entire abdomen was carried out. The patient was placed in Trendelenburg and left lateral decubitus position.  The scope was moved to the right upper quadrant port site. The calcified mass was immediately identified in the omentum of the right lower quadrant.  The mass was grasped and the harmonic scalpel was used to resect the surrounding omentum, including the mass.  This was placed in an Endocatch sac and removed through the umbilical port site.  We reinserted the Hasson cannula.  The cecum was mobilized medially.  The appendix was identified and looked normal.  The appendix was carefully dissected. The appendix was was skeletonized with the harmonic scalpel.   The appendix was divided at its base using an endo-GIA stapler. Minimal appendiceal stump was left in place. There was no evidence of bleeding, leakage, or complication after division of the appendix. Irrigation was also performed and irrigate suctioned from the abdomen as well.  Both ovaries appeared grossly normal.  The umbilical port site was closed with the purse string suture. There was no residual palpable fascial defect.  The trocar site skin wounds were closed with 4-0 Monocryl.  Instrument, sponge, and needle counts were correct at the conclusion of the case.   Findings: Normal appendix Calcified omental mass - right lower quadrant  Estimated Blood Loss:  Minimal         Drains: none         Specimens: RLQ mass/ appendix         Complications:  None; patient tolerated the procedure well.  Disposition: PACU - hemodynamically stable.         Condition: stable   Imogene Burn. Georgette Dover, MD, [facility]  General/ Trauma Surgery  11/25/2014 9:23 AM

## 2014-11-25 NOTE — Interval H&P Note (Signed)
History and Physical Interval Note:  11/25/2014 7:18 AM  Melanie Jimenez  has presented today for surgery, with the diagnosis of Right Lower [place] Mass  The various methods of treatment have been discussed with the patient and family. After consideration of risks, benefits and other options for treatment, the patient has consented to  Procedure(s): APPENDECTOMY LAPAROSCOPIC AND EXC ABDOMINAL MASS (N/A) as a surgical intervention .  The patient's history has been reviewed, patient examined, no change in status, stable for surgery.  I have reviewed the patient's chart and labs.  Questions were answered to the patient's satisfaction.     Melanie Jimenez K.

## 2014-11-25 NOTE — Anesthesia Postprocedure Evaluation (Signed)
  Anesthesia Post-op Note  Patient: Melanie Jimenez  Procedure(s) Performed: Procedure(s): LAPAROSCOPIC APPENDECTOMY AND LAPAROSCOPIC EXCISION OF ABDOMINAL MASS (N/A)  Patient Location: PACU  Anesthesia Type:General  Level of Consciousness: awake and alert   Airway and Oxygen Therapy: Patient Spontanous Breathing  Post-op Pain: mild  Post-op Assessment: Post-op Vital signs reviewed, Patient's Cardiovascular Status Stable and Respiratory Function Stable  Post-op Vital Signs: Reviewed  Filed Vitals:   11/25/14 1105  BP: 127/84  Pulse: 79  Temp: 36.5 C  Resp: 18    Complications: No apparent anesthesia complications

## 2014-11-25 NOTE — H&P (View-Only) (Signed)
History of Present Illness Imogene Burn. Joshue Badal MD; 11/17/2014 4:16 PM) Patient words: abd mass.  The patient is a 54 year old female who presents with an abdominal mass. Referred by Dr. Rory Percy for right lower quadrant abdominal mass. This is a 54 year old female who presents after recent workup for hematuria. She was discovered to have multiple kidney stones on noncontrasted CT scan. She has an incidental finding of a 1.6 x 1.7 cm mass in the anterior intra-peritoneal right lower quadrant of uncertain etiology. The patient reports chronic constipation with bowel movements 1-2 times per week. No changes recently. No melena or hematochezia. She has no pain associated with the mass, but does have flank and back pain from the kidney stones. Other Problems Elbert Ewings, CMA; 11/17/2014 2:19 PM) Anxiety Disorder Back Pain Depression Gastroesophageal Reflux Disease Hypercholesterolemia Kidney Stone  Past Surgical History Elbert Ewings, CMA; 11/17/2014 2:19 PM) Cesarean Section - 1 Oral Surgery Tonsillectomy  Diagnostic Studies History Elbert Ewings, CMA; 11/17/2014 2:19 PM) Colonoscopy never Mammogram 1-3 years ago Pap Smear 1-5 years ago  Allergies Elbert Ewings, CMA; 11/17/2014 2:20 PM) Wellbutrin *ANTIDEPRESSANTS*  Medication History Elbert Ewings, CMA; 11/17/2014 2:20 PM) Crestor (10MG  Tablet, Oral) Active. Sertraline HCl (100MG  Tablet, Oral) Active. LORazepam (0.5MG  Tablet, Oral) Active. Zolpidem Tartrate (10MG  Tablet, Oral) Active. Flomax (0.4MG  Capsule, Oral daily) Active. Medications Reconciled  Social History Elbert Ewings, [place]; 11/17/2014 2:19 PM) Alcohol use Occasional alcohol use. Caffeine use Carbonated beverages. No drug use Tobacco use Current every day smoker.  Family History Elbert Ewings, [place]; 11/17/2014 2:19 PM) Diabetes Mellitus Father, Mother.  Pregnancy / Birth History Elbert Ewings, CMA; 11/17/2014 2:19 PM) Age at menarche 64 years. Age  of menopause [age] Contraceptive History Oral contraceptives. Gravida 1 Maternal age 81-25 Para 1     Review of Systems Elbert Ewings CMA; 11/17/2014 2:19 PM) General Present- Night Sweats and Weight Gain. Not Present- Appetite Loss, Chills, Fatigue, Fever and Weight Loss. Skin Not Present- Change in Wart/Mole, Dryness, Hives, Jaundice, New Lesions, Non-Healing Wounds, Rash and Ulcer. HEENT Present- Ringing in the Ears. Not Present- Earache, Hearing Loss, Hoarseness, Nose Bleed, Oral Ulcers, Seasonal Allergies, Sinus Pain, Sore Throat, Visual Disturbances, Wears glasses/contact lenses and Yellow Eyes. Respiratory Not Present- Bloody sputum, Chronic Cough, Difficulty Breathing, Snoring and Wheezing. Breast Not Present- Breast Mass, Breast Pain, Nipple Discharge and Skin Changes. Cardiovascular Present- Rapid Heart Rate. Not Present- Chest Pain, Difficulty Breathing Lying Down, Leg Cramps, Palpitations, Shortness of Breath and Swelling of Extremities. Gastrointestinal Not Present- Abdominal Pain, Bloating, Bloody Stool, Change in Bowel Habits, Chronic diarrhea, Constipation, Difficulty Swallowing, Excessive gas, Gets full quickly at meals, Hemorrhoids, Indigestion, Nausea, Rectal Pain and Vomiting. Female Genitourinary Present- Frequency and Urgency. Not Present- Nocturia, Painful Urination and Pelvic Pain. Musculoskeletal Present- Back Pain. Not Present- Joint Pain, Joint Stiffness, Muscle Pain, Muscle Weakness and Swelling of Extremities. Neurological Present- Weakness. Not Present- Decreased Memory, Fainting, Headaches, Numbness, Seizures, Tingling, Tremor and Trouble walking. Psychiatric Present- Anxiety and Depression. Not Present- Bipolar, Change in Sleep Pattern, Fearful and Frequent crying. Endocrine Present- Excessive Hunger, Hair Changes, Heat Intolerance and Hot flashes. Not Present- Cold Intolerance and New Diabetes. Hematology Not Present- Easy Bruising, Excessive bleeding, Gland  problems, HIV and Persistent Infections.  Vitals Elbert Ewings CMA; 11/17/2014 2:21 PM) 11/17/2014 2:21 PM Weight: 158 lb Height: 64in Body Surface Area: 1.8 m Body Mass Index: 27.12 kg/m Temp.: 98.78F(Oral)  Pulse: 97 (Regular)  Resp.: 18 (Unlabored)  BP: 128/72 (Sitting, Left Arm, Standard)  Physical Exam Rodman Key K. Sabra Sessler MD; 11/17/2014 4:16 PM)  The physical exam findings are as follows: Note:WDWN in NAD HEENT: EOMI, sclera anicteric Neck: No masses, no thyromegaly Lungs: CTA bilaterally; normal respiratory effort CV: Regular rate and rhythm; no murmurs Abd: +bowel sounds, soft, non-tender, no masses Ext: Well-perfused; no edema Skin: Warm, dry; no sign of jaundice    Assessment & Plan Rodman Key K. Gotti Alwin MD; 11/17/2014 4:18 PM)  RIGHT LOWER QUADRANT ABDOMINAL MASS (789.33  R19.03)  Current Plans I cannot determine the etiology of the RLQ calcified mass, but it seems to be in the area of the cecum/ appendix. The appendix is not easily identified on the scan because of the lack of contrast. This may represent a mass growing at the tip of the appendix. Her options are 6 month follow-up with repeat CT scan or laparoscopic removal of the mass. She wants to go ahead and have this removed as soon as possible.  Schedule for Surgery - Laparoscopic appendectomy/ removal of abdominal mass. The surgical procedure has been discussed with the patient. Potential risks, benefits, alternative treatments, and expected outcomes have been explained. All of the patient's questions at this time have been answered. The likelihood of reaching the patient's treatment goal is good. The patient understand the proposed surgical procedure and wishes to proceed.   Imogene Burn. Georgette Dover, MD, [facility]  General/ Trauma Surgery  11/17/2014 4:18 PM

## 2014-11-25 NOTE — Transfer of Care (Signed)
Immediate Anesthesia Transfer of Care Note  Patient: Melanie Jimenez  Procedure(s) Performed: Procedure(s): LAPAROSCOPIC APPENDECTOMY AND LAPAROSCOPIC EXCISION OF ABDOMINAL MASS (N/A)  Patient Location: PACU  Anesthesia Type:General  Level of Consciousness: awake, alert , oriented and patient cooperative  Airway & Oxygen Therapy: Patient Spontanous Breathing and Patient connected to nasal cannula oxygen  Post-op Assessment: Report given to RN and Post -op Vital signs reviewed and stable  Post vital signs: Reviewed and stable  Last Vitals:  Filed Vitals:   11/25/14 0930  BP:   Pulse: 97  Temp:   Resp: 14    Complications: No apparent anesthesia complications

## 2014-11-26 ENCOUNTER — Encounter (HOSPITAL_COMMUNITY): Payer: Self-pay | Admitting: Surgery

## 2014-12-16 ENCOUNTER — Other Ambulatory Visit: Payer: BLUE CROSS/BLUE SHIELD | Admitting: Adult Health

## 2015-01-15 ENCOUNTER — Ambulatory Visit
Admission: RE | Admit: 2015-01-15 | Discharge: 2015-01-15 | Disposition: A | Discharge status: Home / Self Care | Payer: BLUE CROSS/BLUE SHIELD | Source: Ambulatory Visit | Attending: Surgery | Admitting: Surgery

## 2015-01-15 ENCOUNTER — Other Ambulatory Visit: Payer: Self-pay | Admitting: Surgery

## 2015-01-15 DIAGNOSIS — R1032 Left lower quadrant pain: Secondary | ICD-10-CM

## 2015-01-20 ENCOUNTER — Encounter: Payer: Self-pay | Admitting: Adult Health

## 2015-01-20 ENCOUNTER — Ambulatory Visit (INDEPENDENT_AMBULATORY_CARE_PROVIDER_SITE_OTHER): Payer: BLUE CROSS/BLUE SHIELD | Admitting: Adult Health

## 2015-01-20 VITALS — BP 108/70 | HR 100 | Ht 63.25 in | Wt 155.5 lb

## 2015-01-20 DIAGNOSIS — Z1212 Encounter for screening for malignant neoplasm of rectum: Secondary | ICD-10-CM | POA: Diagnosis not present

## 2015-01-20 DIAGNOSIS — Z139 Encounter for screening, unspecified: Secondary | ICD-10-CM

## 2015-01-20 DIAGNOSIS — Z01419 Encounter for gynecological examination (general) (routine) without abnormal findings: Secondary | ICD-10-CM | POA: Diagnosis not present

## 2015-01-20 DIAGNOSIS — K5904 Chronic idiopathic constipation: Secondary | ICD-10-CM

## 2015-01-20 HISTORY — DX: Chronic idiopathic constipation: K59.04

## 2015-01-20 LAB — HEMOCCULT GUIAC POC 1CARD (OFFICE): FECAL OCCULT BLD: NEGATIVE

## 2015-01-20 NOTE — Patient Instructions (Signed)
Pap and physical in 1 year Mammogram yearly Referred to Dr Oneida Alar for colonoscopy

## 2015-01-20 NOTE — Progress Notes (Signed)
Patient ID: Melanie Jimenez, female   DOB: [birth year], 54 y.o.   MRN: [id number] History of Present Illness: Melanie Jimenez is a 54 year old white female in for well woman gyn exam, her last pap was 08/28/12 and was normal with negative HPV.She had appendix removed about 6 weeks ago. PCP is Dr Nadara Mustard.  Current Medications, Allergies, Past Medical History, Past Surgical History, Family History and Social History were reviewed in [employer] record.     Review of Systems: Patient denies any headaches, hearing loss, fatigue, blurred vision, shortness of breath, chest pain, abdominal pain, problems with urination, or intercourse. No joint pain or mood swings.Has constipation is taking miralax.She does have pain in forearm and has splint on.Has not had a period in 2 years and no hot flashes at present.    Physical Exam:BP 108/70 mmHg  Pulse 100  Ht 5' 3.25" (1.607 m)  Wt 155 lb 8 oz (70.534 kg)  BMI 27.31 kg/m2 General:  Well developed, well nourished, no acute distress Skin:  Warm and dry Neck:  Midline trachea, normal thyroid, good ROM, no lymphadenopathy Lungs; Clear to auscultation bilaterally Breast:  No dominant palpable mass, retraction, or nipple discharge Cardiovascular: Regular rate and rhythm Abdomen:  Soft, non tender, no hepatosplenomegaly Pelvic:  External genitalia is normal in appearance, no lesions.  The vagina is normal in appearance. Urethra has no lesions or masses. The cervix is smooth.  Uterus is felt to be normal size, shape, and contour.  No adnexal masses or tenderness noted.Bladder is non tender, no masses felt. Rectal: Good sphincter tone, no polyps, or hemorrhoids felt.  Hemoccult negative. Extremities/musculoskeletal:  No swelling or varicosities noted, no clubbing or cyanosis Psych:  No mood changes, alert and cooperative,seems happy   Impression: Well woman gyn exam no pap Constipation     Plan: Referred to Dr Oneida Alar for colonoscopy Mammogram  yearly Labs with PCP Pap and physical in 1 year Try 5-6 prunes a day

## 2015-03-11 ENCOUNTER — Encounter: Payer: Self-pay | Admitting: Adult Health

## 2015-04-07 ENCOUNTER — Telehealth: Payer: Self-pay

## 2015-04-07 NOTE — Telephone Encounter (Signed)
Gastroenterology Pre-Procedure Review  Request Date: 04/07/2015 Requesting Physician: Derrek Monaco, NP  PATIENT REVIEW QUESTIONS: The patient responded to the following health history questions as indicated:    This will be first colonoscopy  1. Diabetes Melitis: no 2. Joint replacements in the past 12 months: no 3. Major health problems in the past 3 months: no 4. Has an artificial valve or MVP: no 5. Has a defibrillator: no 6. Has been advised in past to take antibiotics in advance of a procedure like teeth cleaning: no 7. Family history of colon cancer: no  8. Alcohol Use: no    MEDICATIONS & ALLERGIES:    Patient reports the following regarding taking any blood thinners:   Plavix? no Aspirin? no Coumadin? no  Patient confirms/reports the following medications:  Current Outpatient Prescriptions  Medication Sig Dispense Refill  . CRESTOR 10 MG tablet Take 10 mg by mouth daily.     Marland Kitchen LORazepam (ATIVAN) 0.5 MG tablet Take 0.5 mg by mouth daily.     . polyethylene glycol (MIRALAX / GLYCOLAX) packet Take 17 g by mouth daily. Takes about every other day    . sertraline (ZOLOFT) 100 MG tablet Take 100 mg by mouth every morning.  0  . zolpidem (AMBIEN) 10 MG tablet Take 10 mg by mouth at bedtime as needed.      No current facility-administered medications for this visit.    Patient confirms/reports the following allergies:  Allergies  Allergen Reactions  . Wellbutrin [Bupropion] Other (See Comments)    Seizures and passed out    No orders of the defined types were placed in this encounter.    AUTHORIZATION INFORMATION Primary Insurance:  ID #:  Group #:  Pre-Cert / Auth required:  Pre-Cert / Auth #:   Secondary Insurance:   ID #:   Group #:  Pre-Cert / Auth required:  Pre-Cert / Auth #:   SCHEDULE INFORMATION: Procedure has been scheduled as follows:  Date:  05/14/2015             Time:  11:30 AM Location: [facility]   This Gastroenterology  Pre-Precedure Review Form is being routed to the following provider(s): Barney Drain, MD

## 2015-04-07 NOTE — Telephone Encounter (Signed)
PREPOPIK-DRINK WATER TO KEEP URINE LIGHT YELLOW. FULL LIQUID DIET WITH BREAKFAST.  Full Liquid Diet A high-calorie, high-protein supplement should be used to meet your nutritional requirements when the full liquid diet is continued for more than 2 or 3 days. If this diet is to be used for an extended period of time (more than 7 days), a multivitamin should be considered.  Breads and Starches  Allowed: None are allowed except crackersWHOLE OR pureed (made into a thick, smooth soup) in soup.   Avoid: Any others.    Potatoes/Pasta/Rice  Allowed: ANY ITEM AS A SOUP OR SMALL PLATE OF MASHED POTATOES OR SCRAMBLED EGGS.       Vegetables  Allowed: Strained tomato or vegetable juice. Vegetables pureed in soup.   Avoid: Any others.    Fruit  Allowed: Any strained fruit juices and fruit drinks. Include 1 serving of citrus or vitamin C-enriched fruit juice daily.   Avoid: Any others.  Meat and Meat Substitutes  Allowed: Egg  Avoid: Any meat, fish, or fowl. All cheese.  Milk  Allowed: SOY Milk beverages, including milk shakes and instant breakfast mixes. Smooth yogurt.   Avoid: Any others. Avoid dairy products if not tolerated.    Soups and Combination Foods  Allowed: Broth, strained cream soups. Strained, broth-based soups.   Avoid: Any others.    Desserts and Sweets  Allowed: flavored gelatin, tapioca, ice cream, sherbet, smooth pudding, junket, fruit ices, frozen ice pops, pudding pops, frozen fudge pops, chocolate syrup. Sugar, honey, jelly, syrup.   Avoid: Any others.  Fats and Oils  Allowed: Margarine, butter, cream, sour cream, oils.   Avoid: Any others.  Beverages  Allowed: All.   Avoid: None.  Condiments  Allowed: Iodized salt, pepper, spices, flavorings. Cocoa powder.   Avoid: Any others.    SAMPLE MEAL PLAN Breakfast   cup orange juice.   1 OR 2 EGGS  1 cup milk.   1 cup beverage (coffee or tea).   Cream or sugar, if desired.     Midmorning Snack  2 SCRAMBLED OR HARD BOILED EGG   Lunch  1 cup cream soup.    cup fruit juice.   1 cup milk.    cup custard.   1 cup beverage (coffee or tea).   Cream or sugar, if desired.    Midafternoon Snack  1 cup milk shake.  Dinner  1 cup cream soup.    cup fruit juice.   1 cup MILK    cup pudding.   1 cup beverage (coffee or tea).   Cream or sugar, if desired.  Evening Snack  1 cup supplement.  To increase calories, add sugar, cream, butter, or margarine if possible. Nutritional supplements will also increase the total calories.

## 2015-04-07 NOTE — Telephone Encounter (Signed)
See separate triage.  

## 2015-04-07 NOTE — Telephone Encounter (Signed)
Patient called for DS. She wanted to know the latest a colonoscopy could be scheduled. I told her that I didn't know and since DS was the triage nurse she was more familiar with scheduling at the hospital. Please call patient at [phone]

## 2015-04-08 MED ORDER — SOD PICOSULFATE-MAG OX-CIT ACD 10-3.5-12 MG-GM-GM PO PACK: 1.0000 | PACK | ORAL | Status: DC

## 2015-04-08 NOTE — Telephone Encounter (Signed)
Pt called and left VM she does not want the colonoscopy on 05/14/2015. Said she and her husband have so much going on now, she will probably wait til first of the year and call back when she is ready.

## 2015-04-08 NOTE — Telephone Encounter (Signed)
Rx sent to the pharmacy and instructions mailed to pt.  

## 2015-04-08 NOTE — Telephone Encounter (Signed)
REVIEWED-NO ADDITIONAL RECOMMENDATIONS. 

## 2016-02-15 ENCOUNTER — Other Ambulatory Visit: Payer: Self-pay | Admitting: Adult Health

## 2016-02-15 DIAGNOSIS — Z1231 Encounter for screening mammogram for malignant neoplasm of breast: Secondary | ICD-10-CM

## 2016-03-01 ENCOUNTER — Ambulatory Visit (HOSPITAL_COMMUNITY)
Admission: RE | Admit: 2016-03-01 | Discharge: 2016-03-01 | Disposition: A | Discharge status: Home / Self Care | Payer: Managed Care, Other (non HMO) | Source: Ambulatory Visit | Attending: Adult Health | Admitting: Adult Health

## 2016-03-01 DIAGNOSIS — Z1231 Encounter for screening mammogram for malignant neoplasm of breast: Secondary | ICD-10-CM | POA: Diagnosis not present

## 2016-03-02 ENCOUNTER — Telehealth: Payer: Self-pay

## 2016-03-02 NOTE — Telephone Encounter (Signed)
[id number]  Patient called to schedule screening tcs

## 2016-03-13 ENCOUNTER — Telehealth: Payer: Self-pay | Admitting: Adult Health

## 2016-03-13 NOTE — Telephone Encounter (Signed)
LMOM to call.

## 2016-03-13 NOTE — Telephone Encounter (Signed)
Spoke with pt letting her know mammogram was normal. Pt voiced understanding. JSY 

## 2016-03-20 ENCOUNTER — Telehealth: Payer: Self-pay

## 2016-03-20 NOTE — Telephone Encounter (Signed)
LMOM to call.

## 2016-03-21 NOTE — Telephone Encounter (Signed)
See triage

## 2016-03-22 NOTE — Telephone Encounter (Signed)
FULL LIQUID DIET WITH BREAKFAST.  Full Liquid Diet A high-calorie, high-protein supplement should be used to meet your nutritional requirements when the full liquid diet is continued for more than 2 or 3 days. If this diet is to be used for an extended period of time (more than 7 days), a multivitamin should be considered.  Breads and Starches  Allowed: None are allowed    Avoid: Any others.    Potatoes/Pasta/Rice  Allowed: ANY ITEM AS A SOUP OR SMALL PLATE OF MASHED POTATOES OR SCRAMBLED EGGS.       Vegetables  Allowed: Strained tomato or vegetable juice. Vegetables pureed in soup.   Avoid: Any others.    Fruit  Allowed: Any strained fruit juices and fruit drinks. Include 1 serving of citrus or vitamin C-enriched fruit juice daily.   Avoid: Any others.  Meat and Meat Substitutes  Allowed: Egg  Avoid: Any meat, fish, or fowl. All cheese.  Milk  Allowed: SOY Milk beverages, including milk shakes and instant breakfast mixes. Smooth yogurt.   Avoid: Any others. Avoid dairy products if not tolerated.    Soups and Combination Foods  Allowed: Broth, strained cream soups. Strained, broth-based soups.   Avoid: Any others.    Desserts and Sweets  Allowed: flavored gelatin, tapioca, ice cream, sherbet, smooth pudding, junket, fruit ices, frozen ice pops, pudding pops, frozen fudge pops, chocolate syrup. Sugar, honey, jelly, syrup.   Avoid: Any others.  Fats and Oils  Allowed: Margarine, butter, cream, sour cream, oils.   Avoid: Any others.  Beverages  Allowed: All.   Avoid: None.  Condiments  Allowed: Iodized salt, pepper, spices, flavorings. Cocoa powder.   Avoid: Any others.    SAMPLE MEAL PLAN Breakfast   cup orange juice.   1 OR 2 EGGS  1 cup milk.   1 cup beverage (coffee or tea).   Cream or sugar, if desired.    Midmorning Snack  2 SCRAMBLED OR HARD BOILED EGG   Lunch  1 cup cream soup.    cup fruit juice.   1 cup milk.     cup custard.   1 cup beverage (coffee or tea).   Cream or sugar, if desired.    Midafternoon Snack  1 cup milk shake.  Dinner  1 cup cream soup.    cup fruit juice.   1 cup MILK    cup pudding.   1 cup beverage (coffee or tea).   Cream or sugar, if desired.  Evening Snack  1 cup supplement.  To increase calories, add sugar, cream, butter, or margarine if possible. Nutritional supplements will also increase the total calories.

## 2016-03-22 NOTE — Telephone Encounter (Signed)
Gastroenterology Pre-Procedure Review  Request Date: 03/20/2016 Requesting Physician:   PATIENT REVIEW QUESTIONS: The patient responded to the following health history questions as indicated:    1. Diabetes Melitis: no 2. Joint replacements in the past 12 months: no 3. Major health problems in the past 3 months: no 4. Has an artificial valve or MVP: no 5. Has a defibrillator: no 6. Has been advised in past to take antibiotics in advance of a procedure like teeth cleaning: no 7. Family history of colon cancer: no  8. Alcohol Use: no 9. History of sleep apnea: no     MEDICATIONS & ALLERGIES:    Patient reports the following regarding taking any blood thinners:   Plavix? no Aspirin? no Coumadin? no  Patient confirms/reports the following medications:  Current Outpatient Prescriptions  Medication Sig Dispense Refill  . CRESTOR 10 MG tablet Take 10 mg by mouth daily.     Marland Kitchen LORazepam (ATIVAN) 0.5 MG tablet Take 0.5 mg by mouth daily.     . polyethylene glycol (MIRALAX / GLYCOLAX) packet Take 17 g by mouth daily. Takes about every other day    . sertraline (ZOLOFT) 100 MG tablet Take 100 mg by mouth every morning.  0  . zolpidem (AMBIEN) 10 MG tablet Take 10 mg by mouth at bedtime as needed.     . Sod Picosulfate-Mag Ox-Cit Acd 10-3.5-12 MG-GM-GM PACK Take 1 Container by mouth as directed. 1 each 0   No current facility-administered medications for this visit.     Patient confirms/reports the following allergies:  Allergies  Allergen Reactions  . Wellbutrin [Bupropion] Other (See Comments)    Seizures and passed out    No orders of the defined types were placed in this encounter.   AUTHORIZATION INFORMATION Primary Insurance:  ID #:  Group #:  Pre-Cert / Auth required:  Pre-Cert / Auth #:   Secondary Insurance:   ID #:   Group #:  Pre-Cert / Auth required: Pre-Cert / Auth #:   SCHEDULE INFORMATION: Procedure has been scheduled as follows:  Date:   04/07/2016                Time: 9:30 AM  Location:   This Gastroenterology Pre-Precedure Review Form is being routed to the following provider(s): Barney Drain, MD

## 2016-03-23 ENCOUNTER — Other Ambulatory Visit: Payer: Self-pay

## 2016-03-23 DIAGNOSIS — Z1211 Encounter for screening for malignant neoplasm of colon: Secondary | ICD-10-CM

## 2016-03-23 NOTE — Telephone Encounter (Signed)
Pt already had the prepopik from last year. It does not expire until 04/18/2016.  Instructions mailed to pt.

## 2016-04-05 ENCOUNTER — Telehealth: Payer: Self-pay

## 2016-04-05 NOTE — Telephone Encounter (Signed)
Called [employer] @ [phone] and spoke to Pearla Dubonnet who said that a PA is not required for Screening colonoscopy. Called her at 4:06 pm EST.

## 2016-04-07 ENCOUNTER — Ambulatory Visit (HOSPITAL_COMMUNITY)
Admission: RE | Admit: 2016-04-07 | Discharge: 2016-04-07 | Disposition: A | Discharge status: Home / Self Care | Payer: Managed Care, Other (non HMO) | Source: Ambulatory Visit | Attending: Gastroenterology | Admitting: Gastroenterology

## 2016-04-07 ENCOUNTER — Encounter (HOSPITAL_COMMUNITY)
Admission: RE | Disposition: A | Discharge status: Home / Self Care | Payer: Self-pay | Source: Ambulatory Visit | Attending: Gastroenterology

## 2016-04-07 ENCOUNTER — Encounter (HOSPITAL_COMMUNITY): Payer: Self-pay | Admitting: *Deleted

## 2016-04-07 DIAGNOSIS — Z1211 Encounter for screening for malignant neoplasm of colon: Secondary | ICD-10-CM

## 2016-04-07 DIAGNOSIS — F329 Major depressive disorder, single episode, unspecified: Secondary | ICD-10-CM | POA: Insufficient documentation

## 2016-04-07 DIAGNOSIS — Z8371 Family history of colonic polyps: Secondary | ICD-10-CM | POA: Diagnosis not present

## 2016-04-07 DIAGNOSIS — Z1212 Encounter for screening for malignant neoplasm of rectum: Secondary | ICD-10-CM

## 2016-04-07 DIAGNOSIS — K648 Other hemorrhoids: Secondary | ICD-10-CM | POA: Insufficient documentation

## 2016-04-07 DIAGNOSIS — Q438 Other specified congenital malformations of intestine: Secondary | ICD-10-CM | POA: Insufficient documentation

## 2016-04-07 DIAGNOSIS — Z800 Family history of malignant neoplasm of digestive organs: Secondary | ICD-10-CM | POA: Diagnosis not present

## 2016-04-07 DIAGNOSIS — F17210 Nicotine dependence, cigarettes, uncomplicated: Secondary | ICD-10-CM | POA: Diagnosis not present

## 2016-04-07 DIAGNOSIS — D124 Benign neoplasm of descending colon: Secondary | ICD-10-CM

## 2016-04-07 DIAGNOSIS — Z79899 Other long term (current) drug therapy: Secondary | ICD-10-CM | POA: Insufficient documentation

## 2016-04-07 HISTORY — PX: POLYPECTOMY: SHX5525

## 2016-04-07 HISTORY — PX: COLONOSCOPY: SHX5424

## 2016-04-07 SURGERY — COLONOSCOPY
Anesthesia: Moderate Sedation

## 2016-04-07 MED ORDER — MEPERIDINE HCL 100 MG/ML IJ SOLN
INTRAMUSCULAR | Status: AC
  Filled 2016-04-07: qty 2

## 2016-04-07 MED ORDER — SODIUM CHLORIDE 0.9 % IV SOLN
INTRAVENOUS | Status: DC
  Administered 2016-04-07: 1000 mL via INTRAVENOUS

## 2016-04-07 MED ORDER — MIDAZOLAM HCL 5 MG/5ML IJ SOLN
INTRAMUSCULAR | Status: DC | PRN
  Administered 2016-04-07 (×3): 2 mg via INTRAVENOUS
  Administered 2016-04-07: 1 mg via INTRAVENOUS

## 2016-04-07 MED ORDER — MEPERIDINE HCL 100 MG/ML IJ SOLN
INTRAMUSCULAR | Status: DC | PRN
  Administered 2016-04-07 (×2): 50 mg via INTRAVENOUS
  Administered 2016-04-07: 25 mg via INTRAVENOUS

## 2016-04-07 MED ORDER — STERILE WATER FOR IRRIGATION IR SOLN
Status: DC | PRN
  Administered 2016-04-07: 11:00:00

## 2016-04-07 MED ORDER — MIDAZOLAM HCL 5 MG/5ML IJ SOLN
INTRAMUSCULAR | Status: AC
  Filled 2016-04-07: qty 10

## 2016-04-07 NOTE — H&P (Signed)
Primary Care Physician:  Rory Percy, MD Primary Gastroenterologist:  Dr. Oneida Alar  Pre-Procedure History & Physical: HPI:  Melanie Jimenez is a 55 y.o. female here for [place].  Past Medical History:  Diagnosis Date  . Constipation - functional 01/20/2015  . Depression   . GERD (gastroesophageal reflux disease)   . Hot flashes 10/15/2013  . Tinnitus 10/15/2013  . Tinnitus of both ears     Past Surgical History:  Procedure Laterality Date  . APPENDECTOMY    . CESAREAN SECTION    . LAPAROSCOPIC APPENDECTOMY N/A 11/25/2014   Procedure: LAPAROSCOPIC APPENDECTOMY AND LAPAROSCOPIC EXCISION OF ABDOMINAL MASS;  Surgeon: Donnie Mesa, MD;  Location: [facility];  Service: General;  Laterality: N/A;  . lasix surgery    . TONSILECTOMY, ADENOIDECTOMY, BILATERAL MYRINGOTOMY AND TUBES    . TONSILLECTOMY      Prior to Admission medications   Medication Sig Start Date End Date Taking? Authorizing Provider  CRESTOR 10 MG tablet Take 10 mg by mouth daily.  10/13/13  Yes Historical Provider, MD  LORazepam (ATIVAN) 0.5 MG tablet Take 0.5 mg by mouth daily.    Yes Historical Provider, MD  polyethylene glycol (MIRALAX / GLYCOLAX) packet Take 17 g by mouth daily. Takes about every other day   Yes Historical Provider, MD  sertraline (ZOLOFT) 100 MG tablet Take 100 mg by mouth every morning. 07/23/14  Yes Historical Provider, MD  Sod Picosulfate-Mag Ox-Cit Acd 10-3.5-12 MG-GM-GM PACK Take 1 Container by mouth as directed. 04/08/15  Yes Danie Binder, MD  zolpidem (AMBIEN) 10 MG tablet Take 10 mg by mouth at bedtime as needed.  08/15/13   Historical Provider, MD    Allergies as of 03/23/2016 - Review Complete 03/20/2016  Allergen Reaction Noted  . Wellbutrin [bupropion] Other (See Comments) 10/15/2013    Family History  Problem Relation Age of Onset  . Diabetes Mother   . Hyperlipidemia Mother   . Diabetes Father   . Peripheral vascular disease Father   . Diabetes Maternal Grandmother   .  Diabetes Maternal Grandfather     Social History   Social History  . Marital status: Married    Spouse name: N/A  . Number of children: 1  . Years of education: 1   Occupational History  . Not on file.   Social History Main Topics  . Smoking status: Current Every Day Smoker    Packs/day: 0.50    Years: 35.00    Types: Cigarettes  . Smokeless tobacco: Never Used  . Alcohol use No  . Drug use: No  . Sexual activity: Not Currently    Birth control/ protection: Other-see comments     Comment: vasectomy   Other Topics Concern  . Not on file   Social History Narrative   Patient is married with one child.   Patient is right handed.   Patient has hs education.   Patient drinks 2-3 cups daily.    Review of Systems: See HPI, otherwise negative ROS   Physical Exam: BP 133/83   Pulse 70   Temp 98.2 F (36.8 C) (Oral)   Resp 16   Ht 5\' 3"  (1.6 m)   Wt 153 lb (69.4 kg)   SpO2 98%   BMI 27.10 kg/m  General:   Alert,  pleasant and cooperative in NAD Head:  Normocephalic and atraumatic. Neck:  Supple; Lungs:  Clear throughout to auscultation.    Heart:  Regular rate and rhythm. Abdomen:  Soft, nontender and  nondistended. Normal bowel sounds, without guarding, and without rebound.   Neurologic:  Alert and  oriented x4;  grossly normal neurologically.  Impression/Plan:     SCREENING  Plan:  1. TCS TODAY

## 2016-04-07 NOTE — Discharge Instructions (Signed)
You had 1 polyp removed. You have internal hemorrhoids.    DRINK WATER TO KEEP YOUR URINE LIGHT YELLOW.  FOLLOW A HIGH FIBER DIET. AVOID ITEMS THAT CAUSE BLOATING & GAS. SEE INFO BELOW.  YOUR BIOPSY RESULTS WILL BE AVAILABLE IN MY CHART OCT 10 AND MY OFFICE WILL CONTACT YOU IN 10-14 DAYS WITH YOUR RESULTS.   Next colonoscopy in 1-3 years BECAUSE YOUR PREP WAS LESS THAN IDEAL, YOU HAVE A BROTHER WITH POLYPS, AND YOU HAVE A POLYP.    Colonoscopy Care After Read the instructions outlined below and refer to this sheet in the next week. These discharge instructions provide you with general information on caring for yourself after you leave the hospital. While your treatment has been planned according to the most current medical practices available, unavoidable complications occasionally occur. If you have any problems or questions after discharge, call DR. Lovelace Cerveny, [phone].  ACTIVITY  You may resume your regular activity, but move at a slower pace for the next 24 hours.   Take frequent rest periods for the next 24 hours.   Walking will help get rid of the air and reduce the bloated feeling in your belly (abdomen).   No driving for 24 hours (because of the medicine (anesthesia) used during the test).   You may shower.   Do not sign any important legal documents or operate any machinery for 24 hours (because of the anesthesia used during the test).    NUTRITION  Drink plenty of fluids.   You may resume your normal diet as instructed by your doctor.   Begin with a light meal and progress to your normal diet. Heavy or fried foods are harder to digest and may make you feel sick to your stomach (nauseated).   Avoid alcoholic beverages for 24 hours or as instructed.    MEDICATIONS  You may resume your normal medications.   WHAT YOU CAN EXPECT TODAY  Some feelings of bloating in the abdomen.   Passage of more gas than usual.   Spotting of blood in your stool or on the  toilet paper  .  IF YOU HAD POLYPS REMOVED DURING THE COLONOSCOPY:  Eat a soft diet IF YOU HAVE NAUSEA, BLOATING, ABDOMINAL PAIN, OR VOMITING.    FINDING OUT THE RESULTS OF YOUR TEST Not all test results are available during your visit. DR. Oneida Alar WILL CALL YOU WITHIN 14 DAYS OF YOUR PROCEDUE WITH YOUR RESULTS. Do not assume everything is normal if you have not heard from DR. Itzael Liptak, CALL HER OFFICE AT [phone].  SEEK IMMEDIATE MEDICAL ATTENTION AND CALL THE OFFICE: [phone] IF:  You have more than a spotting of blood in your stool.   Your belly is swollen (abdominal distention).   You are nauseated or vomiting.   You have a temperature over 101F.   You have abdominal pain or discomfort that is severe or gets worse throughout the day.   High-Fiber Diet A high-fiber diet changes your normal diet to include more whole grains, legumes, fruits, and vegetables. Changes in the diet involve replacing refined carbohydrates with unrefined foods. The calorie level of the diet is essentially unchanged. The Dietary Reference Intake (recommended amount) for adult males is 38 grams per day. For adult females, it is 25 grams per day. Pregnant and lactating women should consume 28 grams of fiber per day. Fiber is the intact part of a plant that is not broken down during digestion. Functional fiber is fiber that has been isolated from the  plant to provide a beneficial effect in the body. PURPOSE  Increase stool bulk.   Ease and regulate bowel movements.   Lower cholesterol.   REDUCE RISK OF COLON CANCER  INDICATIONS THAT YOU NEED MORE FIBER  Constipation and hemorrhoids.   Uncomplicated diverticulosis (intestine condition) and irritable bowel syndrome.   Weight management.   As a protective measure against hardening of the arteries (atherosclerosis), diabetes, and cancer.   GUIDELINES FOR INCREASING FIBER IN THE DIET  Start adding fiber to the diet slowly. A gradual increase  of about 5 more grams (2 slices of whole-wheat bread, 2 servings of most fruits or vegetables, or 1 bowl of high-fiber cereal) per day is best. Too rapid an increase in fiber may result in constipation, flatulence, and bloating.   Drink enough water and fluids to keep your urine clear or pale yellow. Water, juice, or caffeine-free drinks are recommended. Not drinking enough fluid may cause constipation.   Eat a variety of high-fiber foods rather than one type of fiber.   Try to increase your intake of fiber through using high-fiber foods rather than fiber pills or supplements that contain small amounts of fiber.   The goal is to change the types of food eaten. Do not supplement your present diet with high-fiber foods, but replace foods in your present diet.   INCLUDE A VARIETY OF FIBER SOURCES  Replace refined and processed grains with whole grains, canned fruits with fresh fruits, and incorporate other fiber sources. White rice, white breads, and most bakery goods contain little or no fiber.   Brown whole-grain rice, buckwheat oats, and many fruits and vegetables are all good sources of fiber. These include: broccoli, Brussels sprouts, cabbage, cauliflower, beets, sweet potatoes, white potatoes (skin on), carrots, tomatoes, eggplant, squash, berries, fresh fruits, and dried fruits.   Cereals appear to be the richest source of fiber. Cereal fiber is found in whole grains and bran. Bran is the fiber-rich outer coat of cereal grain, which is largely removed in refining. In whole-grain cereals, the bran remains. In breakfast cereals, the largest amount of fiber is found in those with "bran" in their names. The fiber content is sometimes indicated on the label.   You may need to include additional fruits and vegetables each day.   In baking, for 1 cup white flour, you may use the following substitutions:   1 cup whole-wheat flour minus 2 tablespoons.   1/2 cup white flour plus 1/2 cup whole-wheat  flour.   Polyps, Colon  A polyp is extra tissue that grows inside your body. Colon polyps grow in the large intestine. The large intestine, also called the colon, is part of your digestive system. It is a long, hollow tube at the end of your digestive tract where your body makes and stores stool. Most polyps are not dangerous. They are benign. This means they are not cancerous. But over time, some types of polyps can turn into cancer. Polyps that are smaller than a pea are usually not harmful. But larger polyps could someday become or may already be cancerous. To be safe, doctors remove all polyps and test them.   WHO GETS POLYPS? Anyone can get polyps, but certain people are more likely than others. You may have a greater chance of getting polyps if:  You are over 50.   You have had polyps before.   Someone in your family has had polyps.   Someone in your family has had cancer of the large intestine.  Find out if someone in your family has had polyps. You may also be more likely to get polyps if you:   Eat a lot of fatty foods   Smoke   Drink alcohol   Do not exercise  Eat too much   PREVENTION There is not one sure way to prevent polyps. You might be able to lower your risk of getting them if you:  Eat more fruits and vegetables and less fatty food.   Do not smoke.   Avoid alcohol.   Exercise every day.   Lose weight if you are overweight.   Eating more calcium and folate can also lower your risk of getting polyps. Some foods that are rich in calcium are milk, cheese, and broccoli. Some foods that are rich in folate are chickpeas, kidney beans, and spinach.   Hemorrhoids Hemorrhoids are dilated (enlarged) veins around the rectum. Sometimes clots will form in the veins. This makes them swollen and painful. These are called thrombosed hemorrhoids. Causes of hemorrhoids include:  Constipation.   Straining to have a bowel movement.   HEAVY LIFTING  HOME CARE  INSTRUCTIONS  Eat a well balanced diet and drink 6 to 8 glasses of water every day to avoid constipation. You may also use a bulk laxative.   Avoid straining to have bowel movements.   Keep anal area dry and clean.   Do not use a donut shaped pillow or sit on the toilet for long periods. This increases blood pooling and pain.   Move your bowels when your body has the urge; this will require less straining and will decrease pain and pressure.

## 2016-04-07 NOTE — Op Note (Addendum)
[facility] Patient Name: Melanie Jimenez Procedure Date: 04/07/2016 10:13 AM MRN: [id number] Date of Birth: Oct 10, 1960 Attending MD: Barney Drain , MD CSN: [id number] Age: 55 Admit Type: Outpatient Procedure:                Colonoscopy with SNARE CAUTERY POLYPECTOMY Indications:              Colon cancer screening in patient at increased                            risk: Family history of 1st-degree relative with                            colon polyps before age 68 years(BROTHER, > 25                            POLYPS). Providers:                Barney Drain, MD, Jeanann Lewandowsky. Sharon Seller, RN, Randa Spike, Technician Referring MD:             Rory Percy, MD Medicines:                Meperidine 125 mg IV, Midazolam 7 mg IV Complications:            No immediate complications. Estimated Blood Loss:     Estimated blood loss: none. Procedure:                Pre-Anesthesia Assessment:                           - Prior to the procedure, a History and Physical                            was performed, and patient medications and                            allergies were reviewed. The patient's tolerance of                            previous anesthesia was also reviewed. The risks                            and benefits of the procedure and the sedation                            options and risks were discussed with the patient.                            All questions were answered, and informed consent                            was obtained. Prior Anticoagulants: The patient has  taken no previous anticoagulant or antiplatelet                            agents. ASA Grade Assessment: II - A patient with                            mild systemic disease. After reviewing the risks                            and benefits, the patient was deemed in                            satisfactory condition to undergo the procedure.            After obtaining informed consent, the colonoscope                            was passed under direct vision. Throughout the                            procedure, the patient's blood pressure, pulse, and                            oxygen saturations were monitored continuously. The                            Colonoscope was introduced through the anus and                            advanced to the the cecum, identified by                            appendiceal orifice and ileocecal valve. The                            colonoscopy was technically difficult and complex                            due to a tortuous colon. Successful completion of                            the procedure was aided by increasing the dose of                            sedation medication and COLOWRAP. The patient                            tolerated the procedure fairly well. The quality of                            the bowel preparation was adequate to identify                            polyps 6 mm and larger  in size, BUT VIEW PARTIAALY                            OSBCURED BY FILM ON SCOPE. The ileocecal valve,                            appendiceal orifice, and rectum were photographed. Scope In: 10:49:24 AM Scope Out: 11:13:00 AM Scope Withdrawal Time: 0 hours 16 minutes 59 seconds  Total Procedure Duration: 0 hours 23 minutes 36 seconds  Findings:      The digital rectal exam was normal.      A 6 mm polyp was found in the mid descending colon. The polyp was       sessile. The polyp was removed with a hot snare. Resection and retrieval       were complete.      Non-bleeding internal hemorrhoids were found. The hemorrhoids were       moderate.      The recto-sigmoid colon and sigmoid colon were moderately redundant. Impression:               - One 6 mm polyp in the mid descending colon,                            removed with a hot snare.                           - Non-bleeding internal  hemorrhoids.                           - Redundant LEFT colon. Moderate Sedation:      Moderate (conscious) sedation was administered by the endoscopy nurse       and supervised by the endoscopist. The following parameters were       monitored: oxygen saturation, heart rate, blood pressure, and response       to care. Total physician intraservice time was 38 minutes. Recommendation:           - High fiber diet.                           - Continue present medications.                           - Await pathology results.                           - Repeat colonoscopy 1-3 YEARS for surveillance DUE                            TO POLYP REMOVAL, BROTHER WITH POLYPS, AND LESS                            THAN IDEAL PREP.                           - Patient has a contact number available for  emergencies. The signs and symptoms of potential                            delayed complications were discussed with the                            patient. Return to normal activities tomorrow.                            Written discharge instructions were provided to the                            patient. Procedure Code(s):        --- Professional ---                           [phone], Colonoscopy, flexible; with removal of                            tumor(s), polyp(s), or other lesion(s) by snare                            technique                           99152, Moderate sedation services provided by the                            same physician or other qualified health care                            professional performing the diagnostic or                            therapeutic service that the sedation supports,                            requiring the presence of an independent trained                            observer to assist in the monitoring of the                            patient's level of consciousness and physiological                            status;  initial 15 minutes of intraservice time,                            patient age 43 years or older                           [phone], Moderate sedation services; each additional  15 minutes intraservice time                           99153, Moderate sedation services; each additional                            15 minutes intraservice time Diagnosis Code(s):        --- Professional ---                           D12.4, Benign neoplasm of descending colon                           K64.8, Other hemorrhoids                           Z83.71, Family history of colonic polyps                           Q43.8, Other specified congenital malformations of                            intestine CPT copyright 2016 [employer] Association. All rights reserved. The codes documented in this report are preliminary and upon coder review may  be revised to meet current compliance requirements. Barney Drain, MD Barney Drain, MD 04/07/2016 11:35:27 AM This report has been signed electronically. Number of Addenda: 0

## 2016-04-14 ENCOUNTER — Encounter (HOSPITAL_COMMUNITY): Payer: Self-pay | Admitting: Gastroenterology

## 2016-04-28 ENCOUNTER — Telehealth: Payer: Self-pay | Admitting: Gastroenterology

## 2016-04-28 NOTE — Telephone Encounter (Signed)
Please call pt. She had ONE simple adenoma REMOVED. TCS PRIOR TO Jul 02, 2016 OR SHE CAN WAIT UP TO A YEAR. SHE SHOULD NOT WAIT ANY LONGER THAN A YEAR DUE TO HER FAMILY HISTORY. HER PREP WAS LESS THAN IDEAL AND POLYPS < 5 MM WOULD NOT HAVE BEEN SEEN. FOLLOW A HIGH fiber diet.

## 2016-04-28 NOTE — Telephone Encounter (Signed)
Pt is aware. She wants to think about it and discuss with her husband and insurance. She will try to call back sometime next week.

## 2016-05-03 ENCOUNTER — Telehealth: Payer: Self-pay

## 2016-05-04 NOTE — Telephone Encounter (Signed)
Triaged for next colonoscopy for 06/09/2016.

## 2016-05-09 DIAGNOSIS — H9313 Tinnitus, bilateral: Secondary | ICD-10-CM | POA: Insufficient documentation

## 2016-05-12 NOTE — Telephone Encounter (Signed)
Gastroenterology Pre-Procedure Review  Request Date: 05/03/2016 Requesting Physician: Dr. Oneida Alar  PATIENT REVIEW QUESTIONS: The patient responded to the following health history questions as indicated:    Pt is requesting the [place] again  1. Diabetes Melitis: no 2. Joint replacements in the past 12 months: no 3. Major health problems in the past 3 months: no 4. Has an artificial valve or MVP: no 5. Has a defibrillator: no 6. Has been advised in past to take antibiotics in advance of a procedure like teeth cleaning: no 7. Family history of colon cancer: no  8. Alcohol Use: no 9. History of sleep apnea: no  10. History of coronary artery or other vascular stents placed within the last 12 months: no    MEDICATIONS & ALLERGIES:    Patient reports the following regarding taking any blood thinners:   Plavix? no Aspirin? no Coumadin? no Brilinta? no Xarelto? no Eliquis? no Pradaxa? no Savaysa? no Effient? no  Patient confirms/reports the following medications:  Current Outpatient Prescriptions  Medication Sig Dispense Refill  . CRESTOR 10 MG tablet Take 10 mg by mouth daily.     Marland Kitchen LORazepam (ATIVAN) 0.5 MG tablet Take 0.5 mg by mouth daily.     . polyethylene glycol (MIRALAX / GLYCOLAX) packet Take 17 g by mouth daily. Takes about every other day    . sertraline (ZOLOFT) 100 MG tablet Take 100 mg by mouth every morning.  0  . zolpidem (AMBIEN) 10 MG tablet Take 10 mg by mouth at bedtime as needed.     . Sod Picosulfate-Mag Ox-Cit Acd 10-3.5-12 MG-GM-GM PACK Take 1 Container by mouth as directed. 1 each 0   No current facility-administered medications for this visit.     Patient confirms/reports the following allergies:  Allergies  Allergen Reactions  . Wellbutrin [Bupropion] Other (See Comments)    Seizures and passed out    No orders of the defined types were placed in this encounter.   AUTHORIZATION INFORMATION Primary Insurance:   ID #:  Group #:  Pre-Cert /  Auth required:  Pre-Cert / Auth #:   Secondary Insurance:  ID #:  Group #:  Pre-Cert / Auth required: Pre-Cert / Auth #:   SCHEDULE INFORMATION: Procedure has been scheduled as follows:  Date:  06/09/2016           Time:  9:30 AM Location: [facility] Short Stay  This Gastroenterology Pre-Precedure Review Form is being routed to the following provider(s): Barney Drain, MD

## 2016-05-14 NOTE — Telephone Encounter (Signed)
REVIEWED. PREPOPIK-DRINK WATER TO KEEP URINE LIGHT YELLOW. AVOID CHICKEN BROTH WITH JELLO. REGULAR BREAKFAST THEN CLEAR LIQUIDS AFTER 9 AM.

## 2016-05-15 ENCOUNTER — Other Ambulatory Visit: Payer: Self-pay

## 2016-05-15 DIAGNOSIS — Z1211 Encounter for screening for malignant neoplasm of colon: Secondary | ICD-10-CM

## 2016-05-15 MED ORDER — SOD PICOSULFATE-MAG OX-CIT ACD 10-3.5-12 MG-GM-GM PO PACK: 1.0000 | PACK | ORAL | 0 refills | Status: DC

## 2016-05-15 NOTE — Telephone Encounter (Signed)
Rx sent to the pharmacy and instructions mailed to pt.  

## 2016-05-15 NOTE — Addendum Note (Signed)
Addended by: Everardo All on: 05/15/2016 08:20 AM   Modules accepted: Orders

## 2016-05-23 ENCOUNTER — Telehealth: Payer: Self-pay | Admitting: Neurology

## 2016-05-23 NOTE — Telephone Encounter (Signed)
Pt called in asking if she needed to have another MRI done. 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 Please call and advise [phone] ext [phone]

## 2016-05-23 NOTE — Telephone Encounter (Signed)
Rn call patient back about needing another MRI . Pt was last seen 08/2014. Rn stated she was told to follow up in 3 months per his last note. Rn stated in order to evaluate her for a MRI she will need to come in for an appt. Pt will look at her schedule and call back for an appt.

## 2016-06-06 ENCOUNTER — Telehealth: Payer: Self-pay

## 2016-06-06 NOTE — Telephone Encounter (Signed)
PT is aware.

## 2016-06-06 NOTE — Telephone Encounter (Signed)
Pt is concerned about her prep. Was told to not have any chicken broth and no jello. She wants to know if that includes the fat free broth and green jello. Please advise!

## 2016-06-06 NOTE — Telephone Encounter (Signed)
LMOM to call.

## 2016-06-06 NOTE — Telephone Encounter (Signed)
Fat free broth ok. No green jello.

## 2016-06-09 ENCOUNTER — Encounter (HOSPITAL_COMMUNITY)
Admission: RE | Disposition: A | Discharge status: Home / Self Care | Payer: Self-pay | Source: Ambulatory Visit | Attending: Gastroenterology

## 2016-06-09 ENCOUNTER — Encounter (HOSPITAL_COMMUNITY): Payer: Self-pay | Admitting: *Deleted

## 2016-06-09 ENCOUNTER — Ambulatory Visit (HOSPITAL_COMMUNITY)
Admission: RE | Admit: 2016-06-09 | Discharge: 2016-06-09 | Disposition: A | Discharge status: Home / Self Care | Payer: Managed Care, Other (non HMO) | Source: Ambulatory Visit | Attending: Gastroenterology | Admitting: Gastroenterology

## 2016-06-09 DIAGNOSIS — Z79899 Other long term (current) drug therapy: Secondary | ICD-10-CM | POA: Insufficient documentation

## 2016-06-09 DIAGNOSIS — Q438 Other specified congenital malformations of intestine: Secondary | ICD-10-CM | POA: Diagnosis not present

## 2016-06-09 DIAGNOSIS — K648 Other hemorrhoids: Secondary | ICD-10-CM | POA: Diagnosis not present

## 2016-06-09 DIAGNOSIS — Z86010 Personal history of colonic polyps: Secondary | ICD-10-CM | POA: Diagnosis not present

## 2016-06-09 DIAGNOSIS — K219 Gastro-esophageal reflux disease without esophagitis: Secondary | ICD-10-CM | POA: Diagnosis not present

## 2016-06-09 DIAGNOSIS — F329 Major depressive disorder, single episode, unspecified: Secondary | ICD-10-CM | POA: Diagnosis not present

## 2016-06-09 DIAGNOSIS — F17210 Nicotine dependence, cigarettes, uncomplicated: Secondary | ICD-10-CM | POA: Diagnosis not present

## 2016-06-09 DIAGNOSIS — K644 Residual hemorrhoidal skin tags: Secondary | ICD-10-CM | POA: Insufficient documentation

## 2016-06-09 DIAGNOSIS — Z1211 Encounter for screening for malignant neoplasm of colon: Secondary | ICD-10-CM | POA: Insufficient documentation

## 2016-06-09 HISTORY — PX: COLONOSCOPY: SHX5424

## 2016-06-09 SURGERY — COLONOSCOPY
Anesthesia: Moderate Sedation

## 2016-06-09 MED ORDER — MIDAZOLAM HCL 5 MG/5ML IJ SOLN
INTRAMUSCULAR | Status: AC
  Filled 2016-06-09: qty 10

## 2016-06-09 MED ORDER — SODIUM CHLORIDE 0.9 % IV SOLN
INTRAVENOUS | Status: DC
  Administered 2016-06-09: 09:00:00 via INTRAVENOUS

## 2016-06-09 MED ORDER — MEPERIDINE HCL 100 MG/ML IJ SOLN
INTRAMUSCULAR | Status: DC
Start: 2016-06-09 — End: 2016-06-09
  Filled 2016-06-09: qty 2

## 2016-06-09 MED ORDER — PROMETHAZINE HCL 25 MG/ML IJ SOLN
25.0000 mg | Freq: Once | INTRAMUSCULAR | Status: AC
  Administered 2016-06-09: 25 mg via INTRAVENOUS

## 2016-06-09 MED ORDER — SODIUM CHLORIDE 0.9% FLUSH
INTRAVENOUS | Status: AC
  Filled 2016-06-09: qty 10

## 2016-06-09 MED ORDER — PROMETHAZINE HCL 25 MG/ML IJ SOLN
INTRAMUSCULAR | Status: AC
  Filled 2016-06-09: qty 1

## 2016-06-09 MED ORDER — MIDAZOLAM HCL 5 MG/5ML IJ SOLN
INTRAMUSCULAR | Status: DC | PRN
  Administered 2016-06-09: 3 mg via INTRAVENOUS
  Administered 2016-06-09: 1 mg via INTRAVENOUS
  Administered 2016-06-09 (×2): 2 mg via INTRAVENOUS

## 2016-06-09 MED ORDER — STERILE WATER FOR IRRIGATION IR SOLN
Status: DC | PRN
  Administered 2016-06-09: 2.5 mL

## 2016-06-09 MED ORDER — MEPERIDINE HCL 100 MG/ML IJ SOLN
INTRAMUSCULAR | Status: DC | PRN
  Administered 2016-06-09: 50 mg via INTRAVENOUS
  Administered 2016-06-09: 25 mg via INTRAVENOUS
  Administered 2016-06-09: 50 mg via INTRAVENOUS

## 2016-06-09 NOTE — Discharge Instructions (Signed)
You have internal AND EXTERNAL hemorrhoids. YOU DID NOT HAVE ANY POLYPS.   FOLLOW A HIGH FIBER DIET. AVOID ITEMS THAT CAUSE BLOATING. SEE INFO BELOW.  USE PREPARATION H FOUR TIMES  A DAY IF NEEDED TO RELIEVE RECTAL PAIN/PRESSURE/BLEEDING.  Next colonoscopy in 5-10 years. Colonoscopy Care After Read the instructions outlined below and refer to this sheet in the next week. These discharge instructions provide you with general information on caring for yourself after you leave the hospital. While your treatment has been planned according to the most current medical practices available, unavoidable complications occasionally occur. If you have any problems or questions after discharge, call DR. Nishaan Stanke, [phone].  ACTIVITY  You may resume your regular activity, but move at a slower pace for the next 24 hours.   Take frequent rest periods for the next 24 hours.   Walking will help get rid of the air and reduce the bloated feeling in your belly (abdomen).   No driving for 24 hours (because of the medicine (anesthesia) used during the test).   You may shower.   Do not sign any important legal documents or operate any machinery for 24 hours (because of the anesthesia used during the test).    NUTRITION  Drink plenty of fluids.   You may resume your normal diet as instructed by your doctor.   Begin with a light meal and progress to your normal diet. Heavy or fried foods are harder to digest and may make you feel sick to your stomach (nauseated).   Avoid alcoholic beverages for 24 hours or as instructed.    MEDICATIONS  You may resume your normal medications.   WHAT YOU CAN EXPECT TODAY  Some feelings of bloating in the abdomen.   Passage of more gas than usual.   Spotting of blood in your stool or on the toilet paper  .  IF YOU HAD POLYPS REMOVED DURING THE COLONOSCOPY:  Eat a soft diet IF YOU HAVE NAUSEA, BLOATING, ABDOMINAL PAIN, OR VOMITING.    FINDING OUT THE  RESULTS OF YOUR TEST Not all test results are available during your visit. DR. Oneida Alar WILL CALL YOU WITHIN 7 DAYS OF YOUR PROCEDUE WITH YOUR RESULTS. Do not assume everything is normal if you have not heard from DR. Devlon Dosher IN ONE WEEK, CALL HER OFFICE AT [phone].  SEEK IMMEDIATE MEDICAL ATTENTION AND CALL THE OFFICE: [phone] IF:  You have more than a spotting of blood in your stool.   Your belly is swollen (abdominal distention).   You are nauseated or vomiting.   You have a temperature over 101F.   You have abdominal pain or discomfort that is severe or gets worse throughout the day.

## 2016-06-09 NOTE — H&P (Signed)
Primary Care Physician:  Rory Percy, MD Primary Gastroenterologist:  Dr. Oneida Alar  Pre-Procedure History & Physical: HPI:  Melanie Jimenez is a 55 y.o. female here for  PERSONAL HISTORY OF POLYPS-incomplete TCS OCT 2017.  Past Medical History:  Diagnosis Date  . Constipation - functional 01/20/2015  . Depression   . GERD (gastroesophageal reflux disease)   . Hot flashes 10/15/2013  . Tinnitus 10/15/2013  . Tinnitus of both ears     Past Surgical History:  Procedure Laterality Date  . APPENDECTOMY    . CESAREAN SECTION    . COLONOSCOPY N/A 04/07/2016   Procedure: COLONOSCOPY;  Surgeon: Danie Binder, MD;  Location: [facility];  Service: Endoscopy;  Laterality: N/A;  9:30 Am  . LAPAROSCOPIC APPENDECTOMY N/A 11/25/2014   Procedure: LAPAROSCOPIC APPENDECTOMY AND LAPAROSCOPIC EXCISION OF ABDOMINAL MASS;  Surgeon: Donnie Mesa, MD;  Location: [facility];  Service: General;  Laterality: N/A;  . lasix surgery    . POLYPECTOMY  04/07/2016   Procedure: POLYPECTOMY;  Surgeon: Danie Binder, MD;  Location: [facility];  Service: Endoscopy;;  colon  . TONSILECTOMY, ADENOIDECTOMY, BILATERAL MYRINGOTOMY AND TUBES    . TONSILLECTOMY      Prior to Admission medications   Medication Sig Start Date End Date Taking? Authorizing Provider  cholecalciferol (VITAMIN D) 1000 units tablet Take 2,000 Units by mouth daily.   Yes Historical Provider, MD  CRESTOR 10 MG tablet Take 10 mg by mouth at bedtime.  10/13/13  Yes Historical Provider, MD  ibuprofen (ADVIL,MOTRIN) 200 MG tablet Take 400 mg by mouth every 6 (six) hours as needed for headache or moderate pain.   Yes Historical Provider, MD  LORazepam (ATIVAN) 0.5 MG tablet Take 0.5 mg by mouth daily. May take an additional 0.5mg s twice daily as needed for anxiety   Yes Historical Provider, MD  polyethylene glycol (MIRALAX / GLYCOLAX) packet Take 17 g by mouth every other day.    Yes Historical Provider, MD  sertraline (ZOLOFT) 100 MG tablet Take 100 mg by  mouth every morning. 07/23/14  Yes Historical Provider, MD  Sod Picosulfate-Mag Ox-Cit Acd 10-3.5-12 MG-GM-GM PACK Take 1 Container by mouth as directed. 05/15/16  Yes Danie Binder, MD  zolpidem (AMBIEN) 10 MG tablet Take 10 mg by mouth at bedtime as needed for sleep.  08/15/13  Yes Historical Provider, MD  pantoprazole (PROTONIX) 40 MG tablet Take 40 mg by mouth daily as needed for indigestion. 03/17/16   Historical Provider, MD    Allergies as of 05/15/2016 - Review Complete 05/03/2016  Allergen Reaction Noted  . Wellbutrin [bupropion] Other (See Comments) 10/15/2013    Family History  Problem Relation Age of Onset  . Diabetes Mother   . Hyperlipidemia Mother   . Diabetes Father   . Peripheral vascular disease Father   . Diabetes Maternal Grandmother   . Diabetes Maternal Grandfather   . Colon cancer Neg Hx     Social History   Social History  . Marital status: Married    Spouse name: N/A  . Number of children: 1  . Years of education: 44   Occupational History  . Not on file.   Social History Main Topics  . Smoking status: Current Every Day Smoker    Packs/day: 0.50    Years: 35.00    Types: Cigarettes  . Smokeless tobacco: Never Used  . Alcohol use No  . Drug use: No  . Sexual activity: Not Currently    Birth control/  protection: Other-see comments     Comment: vasectomy   Other Topics Concern  . Not on file   Social History Narrative   Patient is married with one child.   Patient is right handed.   Patient has hs education.   Patient drinks 2-3 cups daily.    Review of Systems: See HPI, otherwise negative ROS   Physical Exam: BP 110/77   Pulse 96   Temp 97.8 F (36.6 C) (Oral)   Resp 13   Ht 5\' 4"  (1.626 m)   Wt 154 lb (69.9 kg)   SpO2 98%   BMI 26.43 kg/m  General:   Alert,  pleasant and cooperative in NAD Head:  Normocephalic and atraumatic. Neck:  Supple; Lungs:  Clear throughout to auscultation.    Heart:  Regular rate and  rhythm. Abdomen:  Soft, nontender and nondistended. Normal bowel sounds, without guarding, and without rebound.   Neurologic:  Alert and  oriented x4;  grossly normal neurologically.  Impression/Plan:     SCREENING  Plan:  1. TCS TODAY. DISCUSSED PROCEDURE, BENEFITS, & RISKS: < 1% chance of medication reaction, bleeding, perforation, or rupture of spleen/liver.

## 2016-06-09 NOTE — Op Note (Signed)
[facility] Patient Name: Melanie Jimenez Procedure Date: 06/09/2016 10:21 AM MRN: [id number] Date of Birth: 1961/04/10 Attending MD: Barney Drain , MD CSN: [id number] Age: 55 Admit Type: Outpatient Procedure:                Colonoscopy, SCREENING Indications:              High risk colon cancer surveillance: Personal                            history of colonic polyps. TCS OCT 2017 INADEQUATE                            PREP Providers:                Barney Drain, MD, Gwynneth Albright RN, RN,                            Randa Spike, Technician, Aram Candela Referring MD:             Rory Percy, MD Medicines:                Promethazine 25 mg IV, Meperidine 125 mg IV,                            Midazolam 8 mg IV Complications:            No immediate complications. Estimated Blood Loss:     Estimated blood loss: none. Procedure:                Pre-Anesthesia Assessment:                           - Prior to the procedure, a History and Physical                            was performed, and patient medications and                            allergies were reviewed. The patient's tolerance of                            previous anesthesia was also reviewed. The risks                            and benefits of the procedure and the sedation                            options and risks were discussed with the patient.                            All questions were answered, and informed consent                            was obtained. Prior Anticoagulants: The patient has  taken ibuprofen, last dose was 1 day prior to                            procedure. ASA Grade Assessment: II - A patient                            with mild systemic disease. After reviewing the                            risks and benefits, the patient was deemed in                            satisfactory condition to undergo the procedure.                            After obtaining  informed consent, the colonoscope                            was passed under direct vision. Throughout the                            procedure, the patient's blood pressure, pulse, and                            oxygen saturations were monitored continuously. The                            Colonoscope was introduced through the anus and                            advanced to the the cecum, identified by                            appendiceal orifice and ileocecal valve. The                            ileocecal valve, appendiceal orifice, and rectum                            were photographed. The colonoscopy was technically                            difficult and complex due to significant looping.                            Successful completion of the procedure was aided by                            increasing the dose of sedation medication and                            COLOWRAP. The patient tolerated the procedure  fairly well. The quality of the bowel preparation                            was good. Scope In: 10:46:36 AM Scope Out: 11:04:41 AM Scope Withdrawal Time: 0 hours 13 minutes 17 seconds  Total Procedure Duration: 0 hours 18 minutes 5 seconds  Findings:      The recto-sigmoid colon and sigmoid colon were moderately redundant.      The entire examined colon appeared normal.      External and internal hemorrhoids were found. The hemorrhoids were       moderate. Impression:               - Redundant LEFT colon.                           - The entire examined colon is normal.                           -EXTERNAL AND INTERNAL HEMORRHOIDS Moderate Sedation:      Moderate (conscious) sedation was administered by the endoscopy nurse       and supervised by the endoscopist. The following parameters were       monitored: oxygen saturation, heart rate, blood pressure, and response       to care. Total physician intraservice time was 33  minutes. Recommendation:           - High fiber diet.                           - Continue present medications.                           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 NEED                            PEHENRGAN 25MG  IV IN PREOP.                           - Patient has a contact number available for                            emergencies. The signs and symptoms of potential                            delayed complications were discussed with the                            patient. Return to normal activities tomorrow.                            Written discharge instructions were provided to the                            patient. Procedure Code(s):        ---  Professional ---                           [phone], Colonoscopy, flexible; diagnostic, including                            collection of specimen(s) by brushing or washing,                            when performed (separate procedure)                           99152, Moderate sedation services provided by the                            same physician or other qualified health care                            professional performing the diagnostic or                            therapeutic service that the sedation supports,                            requiring the presence of an independent trained                            observer to assist in the monitoring of the                            patient's level of consciousness and physiological                            status; initial 15 minutes of intraservice time,                            patient age 44 years or older                           [id number], Moderate sedation services; each additional                            15 minutes intraservice time Diagnosis Code(s):        --- Professional ---                           Z86.010, Personal history of colonic polyps                            Q43.8, Other specified congenital malformations of                            intestine CPT copyright 2016 [employer]. All rights reserved. The codes documented in this report are preliminary and upon coder review may  be revised to meet current compliance requirements. Barney Drain, MD  Barney Drain, MD 06/09/2016 11:17:40 AM This report has been signed electronically. Number of Addenda: 0

## 2016-06-14 ENCOUNTER — Encounter (HOSPITAL_COMMUNITY): Payer: Self-pay | Admitting: Gastroenterology

## 2016-08-18 ENCOUNTER — Encounter: Payer: Self-pay | Admitting: Cardiovascular Disease

## 2016-08-18 ENCOUNTER — Ambulatory Visit: Payer: Self-pay | Admitting: Cardiovascular Disease

## 2016-08-18 ENCOUNTER — Ambulatory Visit (INDEPENDENT_AMBULATORY_CARE_PROVIDER_SITE_OTHER): Payer: Managed Care, Other (non HMO) | Admitting: Cardiovascular Disease

## 2016-08-18 VITALS — BP 124/88 | HR 91 | Ht 64.0 in | Wt 162.0 lb

## 2016-08-18 DIAGNOSIS — R079 Chest pain, unspecified: Secondary | ICD-10-CM | POA: Diagnosis not present

## 2016-08-18 MED ORDER — NITROGLYCERIN 0.4 MG SL SUBL: 0.4000 mg | SUBLINGUAL_TABLET | SUBLINGUAL | 3 refills | Status: DC | PRN

## 2016-08-18 NOTE — Patient Instructions (Signed)
Your physician recommends that you schedule a follow-up appointment in: 3 weeks with Dr. Bronson Ing.   Your physician has requested that you have en exercise stress myoview. For further information please visit [email or web address]. Please follow instruction sheet, as given.    Your physician has recommended you make the following change in your medication:  Start Aspirin 81mg  daily Nitroglycerin 0.4mg  as needed for chest pain.  Nitroglycerin sublingual tablets What is this medicine? NITROGLYCERIN (nye troe GLI ser in) is a type of vasodilator. It relaxes blood vessels, increasing the blood and oxygen supply to your heart. This medicine is used to relieve chest pain caused by angina. It is also used to prevent chest pain before activities like climbing stairs, going outdoors in cold weather, or sexual activity. This medicine may be used for other purposes; ask your health care provider or pharmacist if you have questions. COMMON BRAND NAME(S): Nitroquick, Nitrostat, Nitrotab What should I tell my health care provider before I take this medicine? They need to know if you have any of these conditions: -anemia -head injury, recent stroke, or bleeding in the brain -liver disease -previous heart attack -an unusual or allergic reaction to nitroglycerin, other medicines, foods, dyes, or preservatives -pregnant or trying to get pregnant -breast-feeding How should I use this medicine? Take this medicine by mouth as needed. At the first sign of an angina attack (chest pain or tightness) place one tablet under your tongue. You can also take this medicine 5 to 10 minutes before an event likely to produce chest pain. Follow the directions on the prescription label. Let the tablet dissolve under the tongue. Do not swallow whole. Replace the dose if you accidentally swallow it. It will help if your mouth is not dry. Saliva around the tablet will help it to dissolve more quickly. Do not eat or drink, smoke  or chew tobacco while a tablet is dissolving. If you are not better within 5 minutes after taking ONE dose of nitroglycerin, call 9-1-1 immediately to seek emergency medical care. Do not take more than 3 nitroglycerin tablets over 15 minutes. If you take this medicine often to relieve symptoms of angina, your doctor or health care professional may provide you with different instructions to manage your symptoms. If symptoms do not go away after following these instructions, it is important to call 9-1-1 immediately. Do not take more than 3 nitroglycerin tablets over 15 minutes. Talk to your pediatrician regarding the use of this medicine in children. Special care may be needed. Overdosage: If you think you have taken too much of this medicine contact a poison control center or emergency room at once. NOTE: This medicine is only for you. Do not share this medicine with others. What if I miss a dose? This does not apply. This medicine is only used as needed. What may interact with this medicine? Do not take this medicine with any of the following medications: -certain migraine medicines like ergotamine and dihydroergotamine (DHE) -medicines used to treat erectile dysfunction like sildenafil, tadalafil, and vardenafil -riociguat This medicine may also interact with the following medications: -alteplase -aspirin -heparin -medicines for high blood pressure -medicines for mental depression -other medicines used to treat angina -phenothiazines like chlorpromazine, mesoridazine, prochlorperazine, thioridazine This list may not describe all possible interactions. Give your health care provider a list of all the medicines, herbs, non-prescription drugs, or dietary supplements you use. Also tell them if you smoke, drink alcohol, or use illegal drugs. Some items may interact with your medicine. What  should I watch for while using this medicine? Tell your doctor or health care professional if you feel your  medicine is no longer working. Keep this medicine with you at all times. Sit or lie down when you take your medicine to prevent falling if you feel dizzy or faint after using it. Try to remain calm. This will help you to feel better faster. If you feel dizzy, take several deep breaths and lie down with your feet propped up, or bend forward with your head resting between your knees. You may get drowsy or dizzy. Do not drive, use machinery, or do anything that needs mental alertness until you know how this drug affects you. Do not stand or sit up quickly, especially if you are an older patient. This reduces the risk of dizzy or fainting spells. Alcohol can make you more drowsy and dizzy. Avoid alcoholic drinks. Do not treat yourself for coughs, colds, or pain while you are taking this medicine without asking your doctor or health care professional for advice. Some ingredients may increase your blood pressure. What side effects may I notice from receiving this medicine? Side effects that you should report to your doctor or health care professional as soon as possible: -blurred vision -dry mouth -skin rash -sweating -the feeling of extreme pressure in the head -unusually weak or tired Side effects that usually do not require medical attention (report to your doctor or health care professional if they continue or are bothersome): -flushing of the face or neck -headache -irregular heartbeat, palpitations -nausea, vomiting This list may not describe all possible side effects. Call your doctor for medical advice about side effects. You may report side effects to FDA at 1-800-FDA-1088. Where should I keep my medicine? Keep out of the reach of children. Store at room temperature between 20 and 25 degrees C (68 and 77 degrees F). Store in [facility] [employer]. Protect from light and moisture. Keep tightly closed. Throw away any unused medicine after the expiration date. NOTE: This sheet is a summary. It may  not cover all possible information. If you have questions about this medicine, talk to your doctor, pharmacist, or health care provider.  2017 Elsevier/Gold Standard (2013-04-17 17:57:36)   Thank you for choosing [facility]!!

## 2016-08-18 NOTE — Progress Notes (Signed)
CARDIOLOGY CONSULT NOTE  Patient ID: Melanie Jimenez MRN: [id number] DOB/AGE: Sep 21, 1960 56 y.o.  Admit date: (Not on file) Primary Physician: Rory Percy, MD Referring Physician:   Reason for Consultation: chest pain  HPI: The patient is a 56 year old woman with a history of GERD who has been referred by Dr. Nadara Mustard for the evaluation of chest pain. As per his letter to me, the patient has been having atypical intermittent chest pain. A stress test was requested but her insurance company denied it. She may be referred to GI and ENT.  ECG performed in the office today which I personally reviewed demonstrates normal sinus rhythm with no ischemic ST segment or T-wave abnormalities, nor any arrhythmias.  She has severe tinnitus with ringing and roaring in her ears. She has seen four ENT physicians and has gone for cognitive therapy without success. She is now seeing a Restaurant manager, fast food. The tinnitus leads to severe stress for which she takes Ativan and Zoloft.  She has chest pain which lasts for several minutes and has awoken her at night. It is located in the upper left chest. It has occurred after walking. There is no associated shortness of breath, nausea, or vomiting. Once she took several Tums and then she took one baby aspirin and got relief after 10 minutes.  She takes Crestor for hyperlipidemia.  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 She denies claudication pain.  Social history: She smoked a pack of cigarettes daily for 30 years but quit 2 weeks ago.     Allergies  Allergen Reactions  . Wellbutrin [Bupropion] Other (See Comments)    Seizures and passed out    Current Outpatient Prescriptions  Medication Sig Dispense Refill  . cholecalciferol (VITAMIN D) 1000 units tablet Take 2,000 Units by mouth daily.    . CRESTOR 10 MG tablet Take 10 mg by mouth at bedtime.     Marland Kitchen ibuprofen (ADVIL,MOTRIN) 200 MG tablet Take 400 mg by mouth every 6 (six) hours as needed for headache or  moderate pain.    Marland Kitchen LORazepam (ATIVAN) 0.5 MG tablet Take 0.5 mg by mouth daily. May take an additional 0.5mg s twice daily as needed for anxiety    . pantoprazole (PROTONIX) 40 MG tablet Take 40 mg by mouth daily as needed for indigestion.    . polyethylene glycol (MIRALAX / GLYCOLAX) packet Take 17 g by mouth every other day.     . sertraline (ZOLOFT) 100 MG tablet Take 100 mg by mouth every morning.  0  . zolpidem (AMBIEN) 10 MG tablet Take 10 mg by mouth at bedtime as needed for sleep.      No current facility-administered medications for this visit.     Past Medical History:  Diagnosis Date  . Constipation - functional 01/20/2015  . Depression   . GERD (gastroesophageal reflux disease)   . Hot flashes 10/15/2013  . Tinnitus 10/15/2013  . Tinnitus of both ears     Past Surgical History:  Procedure Laterality Date  . APPENDECTOMY    . CESAREAN SECTION    . COLONOSCOPY N/A 04/07/2016   Procedure: COLONOSCOPY;  Surgeon: Danie Binder, MD;  [facility];  Service: Endoscopy;  Laterality: N/A;  9:30 Am  . COLONOSCOPY N/A 06/09/2016   Procedure: COLONOSCOPY;  Surgeon: Danie Binder, MD;  [facility];  Service: Endoscopy;  Laterality: N/A;  9:30 am  . LAPAROSCOPIC APPENDECTOMY N/A 11/25/2014   Procedure: LAPAROSCOPIC APPENDECTOMY AND LAPAROSCOPIC  EXCISION OF ABDOMINAL MASS;  Surgeon: Donnie Mesa, MD;  Location: [place];  Service: General;  Laterality: N/A;  . lasix surgery    . POLYPECTOMY  04/07/2016   Procedure: POLYPECTOMY;  Surgeon: Danie Binder, MD;  [facility];  Service: Endoscopy;;  colon  . TONSILECTOMY, ADENOIDECTOMY, BILATERAL MYRINGOTOMY AND TUBES    . TONSILLECTOMY      Social History   Social History  . Marital status: Married    Spouse name: N/A  . Number of children: 1  . Years of education: 37   Occupational History  . Not on file.   Social History Main Topics  . Smoking status: Former Smoker    Packs/day: 0.50    Years:  35.00    Types: Cigarettes, E-cigarettes  . Smokeless tobacco: Never Used     Comment: vapor   . Alcohol use No  . Drug use: No  . Sexual activity: Not Currently    Birth control/ protection: Other-see comments     Comment: vasectomy   Other Topics Concern  . Not on file   Social History Narrative   Patient is married with one child.   Patient is right handed.   Patient has hs education.   Patient drinks 2-3 cups daily.     No family history of premature CAD in 1st degree relatives. Father had PVD and was a heavy smoker.  Prior to Admission medications   Medication Sig Start Date End Date Taking? Authorizing Provider  cholecalciferol (VITAMIN D) 1000 units tablet Take 2,000 Units by mouth daily.    Historical Provider, MD  CRESTOR 10 MG tablet Take 10 mg by mouth at bedtime.  10/13/13   Historical Provider, MD  ibuprofen (ADVIL,MOTRIN) 200 MG tablet Take 400 mg by mouth every 6 (six) hours as needed for headache or moderate pain.    Historical Provider, MD  LORazepam (ATIVAN) 0.5 MG tablet Take 0.5 mg by mouth daily. May take an additional 0.5mg s twice daily as needed for anxiety    Historical Provider, MD  pantoprazole (PROTONIX) 40 MG tablet Take 40 mg by mouth daily as needed for indigestion. 03/17/16   Historical Provider, MD  polyethylene glycol (MIRALAX / GLYCOLAX) packet Take 17 g by mouth every other day.     Historical Provider, MD  sertraline (ZOLOFT) 100 MG tablet Take 100 mg by mouth every morning. 07/23/14   Historical Provider, MD  zolpidem (AMBIEN) 10 MG tablet Take 10 mg by mouth at bedtime as needed for sleep.  08/15/13   Historical Provider, MD     Review of systems complete and found to be negative unless listed above in HPI     Physical exam Blood pressure 124/88, pulse 91, height 5\' 4"  (1.626 m), weight 162 lb (73.5 kg), SpO2 98 %. General: NAD Neck: No JVD, no thyromegaly or thyroid nodule.  Lungs: Clear to auscultation bilaterally with normal  respiratory effort. CV: Nondisplaced PMI. Regular rate and rhythm, normal S1/S2, no S3/S4, no murmur.  No peripheral edema.  No carotid bruit.  Abdomen: Soft, nontender, no hepatosplenomegaly, no distention.  Skin: Intact without lesions or rashes.  Neurologic: Alert and oriented x 3.  Psych: Normal affect. Extremities: No clubbing or cyanosis.  HEENT: Normal.   ECG: Most recent ECG reviewed.  Labs:   Lab Results  Component Value Date   WBC 7.4 11/23/2014   HGB 14.4 11/23/2014   HCT 40.9 11/23/2014   MCV 94.2 11/23/2014   PLT 237 11/23/2014  No results for input(s): NA, K, CL, CO2, BUN, CREATININE, CALCIUM, PROT, BILITOT, ALKPHOS, ALT, AST, GLUCOSE in the last 168 hours.  Invalid input(s): LABALBU No results found for: CKTOTAL, CKMB, CKMBINDEX, TROPONINI No results found for: CHOL No results found for: HDL No results found for: LDLCALC No results found for: TRIG No results found for: CHOLHDL No results found for: LDLDIRECT       Studies: No results found.  ASSESSMENT AND PLAN:  1. Chest pain: Cardiovascular risk factors include hyperlipidemia and long history of tobacco abuse. I will prescribe aspirin 81 mg daily and nitroglycerin to be used as needed. I will proceed with a nuclear myocardial perfusion imaging study to evaluate for ischemic heart disease (exercise Myoview).  Dispo: fu 3 weeks   Signed: Kate Sable, M.D., F.A.C.C.  08/18/2016, 1:44 PM

## 2016-08-30 ENCOUNTER — Encounter (HOSPITAL_COMMUNITY): Payer: Self-pay

## 2016-08-30 ENCOUNTER — Encounter (HOSPITAL_COMMUNITY)
Admission: RE | Admit: 2016-08-30 | Discharge: 2016-08-30 | Disposition: A | Discharge status: Home / Self Care | Payer: Managed Care, Other (non HMO) | Source: Ambulatory Visit | Attending: Cardiovascular Disease | Admitting: Cardiovascular Disease

## 2016-08-30 ENCOUNTER — Encounter (INDEPENDENT_AMBULATORY_CARE_PROVIDER_SITE_OTHER)
Admission: RE | Admit: 2016-08-30 | Discharge: 2016-08-30 | Disposition: A | Discharge status: Home / Self Care | Payer: Managed Care, Other (non HMO) | Source: Ambulatory Visit | Attending: Cardiovascular Disease | Admitting: Cardiovascular Disease

## 2016-08-30 DIAGNOSIS — R079 Chest pain, unspecified: Secondary | ICD-10-CM | POA: Diagnosis present

## 2016-08-30 LAB — NM MYOCAR MULTI W/SPECT W/WALL MOTION / EF
CHL CUP NUCLEAR SRS: 3
CHL CUP NUCLEAR SSS: 8
CSEPED: 10 min
CSEPEW: 11.7 METS
CSEPPHR: 157 {beats}/min
Exercise duration (sec): 22 s
LHR: 0
LV dias vol: 42 mL (ref 46–106)
LV sys vol: 12 mL
MPHR: 167 {beats}/min
NUC STRESS TID: 0.9
Percent HR: 94 %
RPE: 13
Rest HR: 78 {beats}/min
SDS: 5

## 2016-08-30 MED ORDER — SODIUM CHLORIDE 0.9% FLUSH
INTRAVENOUS | Status: AC
  Administered 2016-08-30: 10 mL via INTRAVENOUS
  Filled 2016-08-30: qty 10

## 2016-08-30 MED ORDER — REGADENOSON 0.4 MG/5ML IV SOLN
INTRAVENOUS | Status: AC
  Filled 2016-08-30: qty 5

## 2016-08-30 MED ORDER — TECHNETIUM TC 99M TETROFOSMIN IV KIT
10.0000 | PACK | Freq: Once | INTRAVENOUS | Status: AC | PRN
  Administered 2016-08-30: 10.4 via INTRAVENOUS

## 2016-08-30 MED ORDER — TECHNETIUM TC 99M TETROFOSMIN IV KIT
30.0000 | PACK | Freq: Once | INTRAVENOUS | Status: AC | PRN
  Administered 2016-08-30: 32.7 via INTRAVENOUS

## 2016-09-04 ENCOUNTER — Telehealth: Payer: Self-pay | Admitting: Cardiovascular Disease

## 2016-09-04 NOTE — Telephone Encounter (Signed)
Called for test results of her recent stress test. Please call work # [phone] ext. [place]

## 2016-09-04 NOTE — Telephone Encounter (Signed)
Notes Recorded by Laurine Blazer, LPN on [id number] at 624THL AM EST Patient notified. Copy to pmd. Follow up scheduled for 09/08/2016. ------  Notes Recorded by Laurine Blazer, LPN on [id number] at 075-GRM PM EST Left message to return call.  ------  Notes Recorded by Herminio Commons, MD on 08/31/2016 at 8:45 AM EST Low risk.

## 2016-09-08 ENCOUNTER — Ambulatory Visit (INDEPENDENT_AMBULATORY_CARE_PROVIDER_SITE_OTHER): Payer: Managed Care, Other (non HMO) | Admitting: Cardiovascular Disease

## 2016-09-08 ENCOUNTER — Encounter: Payer: Self-pay | Admitting: Cardiovascular Disease

## 2016-09-08 VITALS — BP 123/79 | HR 85 | Ht 64.0 in | Wt 161.0 lb

## 2016-09-08 DIAGNOSIS — R079 Chest pain, unspecified: Secondary | ICD-10-CM | POA: Diagnosis not present

## 2016-09-08 DIAGNOSIS — I2510 Atherosclerotic heart disease of native coronary artery without angina pectoris: Secondary | ICD-10-CM

## 2016-09-08 DIAGNOSIS — I2584 Coronary atherosclerosis due to calcified coronary lesion: Secondary | ICD-10-CM

## 2016-09-08 NOTE — Patient Instructions (Signed)
Medication Instructions:  Continue all current medications.  Labwork: none  Testing/Procedures: none  Follow-Up: 3 months   Any Other Special Instructions Will Be Listed Below (If Applicable).  If you need a refill on your cardiac medications before your next appointment, please call your pharmacy.  

## 2016-09-08 NOTE — Progress Notes (Signed)
SUBJECTIVE: The patient returns for follow-up after undergoing cardiovascular testing performed for the evaluation of chest pain.  She underwent a low risk nuclear stress test on 08/30/16. There is no evidence of myocardial ischemia or scar. She had good exercise tolerance. LVEF 73%.  Since her last visit with me she had one episode of chest pain which occurred this morning in the left inframammary region. It occurred while she was lying in bed. It was described as sharp. It went away on its own. She questions whether it was due to anxiety as she has had her fourth sinus infection since January. She has chronic tinnitus. She is going to see an ENT specialist in [place] in the near future. She denies fevers. She has not had to use nitroglycerin.  CT of the abdomen and pelvis 01/05/16: Probable calcification in the RCA and diffuse aortic and iliac atherosclerosis.   Review of Systems: As per "subjective", otherwise negative.  Allergies  Allergen Reactions  . Wellbutrin [Bupropion] Other (See Comments)    Seizures and passed out    Current Outpatient Prescriptions  Medication Sig Dispense Refill  . aspirin EC 81 MG tablet Take 81 mg by mouth daily.    . cholecalciferol (VITAMIN D) 1000 units tablet Take 2,000 Units by mouth daily.    . CRESTOR 10 MG tablet Take 10 mg by mouth at bedtime.     . fluticasone (FLONASE) 50 MCG/ACT nasal spray Place 2 sprays into both nostrils daily.  1  . ibuprofen (ADVIL,MOTRIN) 200 MG tablet Take 400 mg by mouth every 6 (six) hours as needed for headache or moderate pain.    Marland Kitchen LORazepam (ATIVAN) 0.5 MG tablet Take 0.5 mg by mouth daily. May take an additional 0.5mg s twice daily as needed for anxiety    . nitroGLYCERIN (NITROSTAT) 0.4 MG SL tablet Place 1 tablet (0.4 mg total) under the tongue every 5 (five) minutes as needed for chest pain. 25 tablet 3  . pantoprazole (PROTONIX) 40 MG tablet Take 40 mg by mouth daily as needed for indigestion.    .  polyethylene glycol (MIRALAX / GLYCOLAX) packet Take 17 g by mouth every other day.     . sertraline (ZOLOFT) 100 MG tablet Take 100 mg by mouth every morning.  0  . zolpidem (AMBIEN) 10 MG tablet Take 10 mg by mouth at bedtime as needed for sleep.      No current facility-administered medications for this visit.     Past Medical History:  Diagnosis Date  . Constipation - functional 01/20/2015  . Depression   . GERD (gastroesophageal reflux disease)   . Hot flashes 10/15/2013  . Tinnitus 10/15/2013  . Tinnitus of both ears     Past Surgical History:  Procedure Laterality Date  . APPENDECTOMY    . CESAREAN SECTION    . COLONOSCOPY N/A 04/07/2016   Procedure: COLONOSCOPY;  Surgeon: Danie Binder, MD;  [facility];  Service: Endoscopy;  Laterality: N/A;  9:30 Am  . COLONOSCOPY N/A 06/09/2016   Procedure: COLONOSCOPY;  Surgeon: Danie Binder, MD;  [facility];  Service: Endoscopy;  Laterality: N/A;  9:30 am  . LAPAROSCOPIC APPENDECTOMY N/A 11/25/2014   Procedure: LAPAROSCOPIC APPENDECTOMY AND LAPAROSCOPIC EXCISION OF ABDOMINAL MASS;  Surgeon: Donnie Mesa, MD;  Location: [facility];  Service: General;  Laterality: N/A;  . lasix surgery    . POLYPECTOMY  04/07/2016   Procedure: POLYPECTOMY;  Surgeon: Danie Binder, MD;  [facility];  Service: Endoscopy;;  colon  . TONSILECTOMY, ADENOIDECTOMY, BILATERAL MYRINGOTOMY AND TUBES    . TONSILLECTOMY      Social History   Social History  . Marital status: Married    Spouse name: N/A  . Number of children: 1  . Years of education: 45   Occupational History  . Not on file.   Social History Main Topics  . Smoking status: Current Some Day Smoker    Packs/day: 0.50    Types: Cigarettes, E-cigarettes    Start date: 01/22/1977  . Smokeless tobacco: Never Used     Comment: curently uses a vape and smokes about 10 cigarettes per day  . Alcohol use No  . Drug use: No  . Sexual activity: Not Currently     Birth control/ protection: Other-see comments     Comment: vasectomy   Other Topics Concern  . Not on file   Social History Narrative   Patient is married with one child.   Patient is right handed.   Patient has hs education.   Patient drinks 2-3 cups daily.     Vitals:   09/08/16 1556  BP: 123/79  Pulse: 85  Weight: 161 lb (73 kg)  Height: 5\' 4"  (1.626 m)    PHYSICAL EXAM General: NAD HEENT: Normal. Neck: No JVD, no thyromegaly. Lungs: Clear to auscultation bilaterally with normal respiratory effort. CV: Nondisplaced PMI.  Regular rate and rhythm, normal S1/S2, no S3/S4, no murmur. No pretibial or periankle edema.  No carotid bruit.   Abdomen: Soft, nontender, no distention.  Neurologic: Alert and oriented.  Psych: Normal affect. Skin: Normal. Musculoskeletal: No gross deformities.    ECG: Most recent ECG reviewed.      ASSESSMENT AND PLAN: 1. Chest pain with CAD (RCA calcification on CT): Cardiovascular risk factors include hyperlipidemia and long history of tobacco abuse. I will continue aspirin 81 mg daily and nitroglycerin to be used as needed. Nuclear stress test was low risk. Given the RCA calcification and her history of tobacco abuse as well as the diffuse aortic and iliac atherosclerosis seen on CT, I think it is reasonable to continue aspirin. I asked her to contact me should she begin to experience more frequent and severe chest pains requiring nitroglycerin.   Dispo: fu 3 months   Kate Sable, M.D., F.A.C.C.

## 2016-12-27 ENCOUNTER — Ambulatory Visit: Payer: Self-pay | Admitting: Cardiovascular Disease

## 2017-03-06 ENCOUNTER — Other Ambulatory Visit: Payer: Self-pay | Admitting: Adult Health

## 2017-03-06 DIAGNOSIS — Z1231 Encounter for screening mammogram for malignant neoplasm of breast: Secondary | ICD-10-CM

## 2017-03-07 ENCOUNTER — Ambulatory Visit (HOSPITAL_COMMUNITY)
Admission: RE | Admit: 2017-03-07 | Discharge: 2017-03-07 | Disposition: A | Discharge status: Home / Self Care | Payer: Managed Care, Other (non HMO) | Source: Ambulatory Visit | Attending: Adult Health | Admitting: Adult Health

## 2017-03-07 DIAGNOSIS — Z1231 Encounter for screening mammogram for malignant neoplasm of breast: Secondary | ICD-10-CM | POA: Diagnosis not present

## 2017-03-15 ENCOUNTER — Ambulatory Visit (INDEPENDENT_AMBULATORY_CARE_PROVIDER_SITE_OTHER): Payer: Managed Care, Other (non HMO) | Admitting: Neurology

## 2017-03-15 ENCOUNTER — Encounter: Payer: Self-pay | Admitting: Neurology

## 2017-03-15 VITALS — BP 131/86 | HR 84 | Wt 159.6 lb

## 2017-03-15 DIAGNOSIS — I671 Cerebral aneurysm, nonruptured: Secondary | ICD-10-CM

## 2017-03-15 NOTE — Progress Notes (Signed)
Neurology     [facility] [place]. [place] [address] [phone]                                                              OFFICE FOLLOW UP VISIT NOTE  Ms. Colman Cater Date of Birth:  [birth year] Medical Record Number:  [id number]   Referring MD: Rory Percy  Reason for Referral:  Right hand pain and swelling  HPI: 45 year Caucasian lady who has been having right forearm and hand pain, numbness and tingling off and on following a blood draw from the right wrist with multiple failed attempts by phlebotomist in August 2011. She had immediate pain at that time. She underwent x-ray of the forearm and elbow on 02/03/2010 which I personally reviewed were unremarkable. Since July 22 this year she'll notice swelling and pain and pulsation over the ventral aspect of the right wrist. The pain is so severe that she can barely use her arm. She's also been complaining of generalized weakness and inability to work. She has had poor appetite and diarrhea and has lost some weight as well. She has not tried any specific medication for this pain. She was seen by vascular surgeon Dr. Adele Barthel 01/23/14 who found the patient to have a palpable cord in the forearm and did an ultrasound of the radial and ulnar arteries which revealed no evidence of aneurysm with normal arterial waveforms. He felt the patient had a prominent ulnar artery which was a normal anatomical variant. Nonsteroidal anti-inflammatory medications recommended that the patient continues to have severe pain and burning. 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 Patient takes lorazepam for her tinnitus which seems to be  unchanged. Update 08/05/2014 : She returns for follow-up after last visit 5 months ago. She tried Topamax for a few weeks but it did not help and hence she discontinued it. She has been taking Ativan when necessary which helps the most. She has also been wearing the right lateral wrist splint which takes some of the edge of the pain off. She had no conduction EMG done by Dr. Lavell Anchors on 03/05/14 which showed normal nerve conduction but study was limited as patient would not cooperate for the needle portion of the study due to pain. MRI scan of the soft tissue of the right wrist done on 03/10/14 films personally reviewed showed pulsatile swelling over the right wrist corresponding to radial and ulnar arteries and no clear vascular tumor or aneurysm was noted. Suggestion was made for evaluation for vasculitis There was suspicion for scaphoid few low lunate ligament tear in the right wrist. Patient was referred to orthopedics in [place] who evaluated her and felt the tear was not clinically significant enough to merit treatment at the present time.. The patient has no known history of rash, recurrent miscarriages, arthralgias or myalgias to raise concern for systemic vasculitis. Update 03/15/2017 ; she returns for follow-up after last visit 2 and half years ago. She was advised  to follow-up at last visit in 3 months but did not keep appointment. She was started on Lyrica for her neuropathic right arm pain and states it did not help her and she stopped it. She continues to have right hand pain but finds that wearing a splint helps her. She also has tinnitus which is now constant and bothersome. She takes Ativan which seems to help. She's also had some frontal headaches for the last 6 months. She is currently seeing ENT physician at [facility] progress recommend balloon sign no plasty. She is awaiting insurance approval to have the procedure performed. The patient was found to have a 3 mm asymptomatic left cavernous  carotid aneurysm on the previous MRA in 2015. She wants to have a follow-up MRI done. She denies any significant severe headaches or new focal neurological symptoms. ROS:   14 system review of systems is positive for ringing in the ears,  hearing loss, ear pain, blurred vision, joint pain and swelling, headache, agitation, concentration decreased, anxiety and nervousness All other systems negative PMH:      Past Medical History  Diagnosis Date  . Depression   . GERD (gastroesophageal reflux disease)   . Tinnitus of both ears   . Hot flashes 10/15/2013  . Tinnitus 10/15/2013    Social History:  History        Social History  . Marital Status: Married    Spouse Name: N/A    Number of Children: 1  . Years of Education: 12      Occupational History  . Not on file.         Social History Main Topics  . Smoking status: Current Every Day Smoker -- 1.00 packs/day    Types: Cigarettes  . Smokeless tobacco: Never Used  . Alcohol Use: 0.0 oz/week    0 Not specified per week     Comment: occasional  . Drug Use: No  . Sexual Activity: Yes    Birth Control/ Protection: Other-see comments     Comment: vasectomy       Other Topics Concern  . Not on file      Social History Narrative   Patient is married with one child.   Patient is right handed.   Patient has hs education.   Patient drinks 2-3 cups daily.    Medications:         Current Outpatient Prescriptions on File Prior to Visit  Medication Sig Dispense Refill     Current Outpatient Prescriptions:  .  ALPRAZolam (XANAX) 0.5 MG tablet, See admin instructions., Disp: , Rfl: 0 .  CRESTOR 10 MG tablet, Take 10 mg by mouth at bedtime. , Disp: , Rfl:  .  fluticasone (FLONASE) 50 MCG/ACT nasal spray, Place 2 sprays into both nostrils daily., Disp: , Rfl: 1 .  LORazepam (ATIVAN) 0.5 MG tablet, Take 0.5 mg by mouth daily. May take an additional 0.5mg s twice daily as needed for anxiety, Disp: ,  Rfl:  .  sertraline (ZOLOFT) 100 MG tablet, Take 100 mg by mouth every morning., Disp: , Rfl: 0 .  zolpidem (AMBIEN) 10 MG tablet, Take 10 mg by mouth at bedtime as needed for sleep. , Disp: , Rfl:     Allergies:        Allergies  Allergen Reactions  . Wellbutrin [Bupropion] Other (See Comments)    Seizures and passed out    Physical Exam General: well developed, well nourished, seated, in no evident distress Head: head normocephalic  and atraumatic.   Neck: supple with no carotid or supraclavicular bruits Cardiovascular: regular rate and rhythm, no murmurs Musculoskeletal: no deformity wearing right wrist splint Skin:  no rash/petichiae. Allodynia in the right wrist and forearm. Soft tissue swelling over the right wrist with visible pulsations over both right radial and ulnar aspects. Soft Bruit palpated over the right brachial artery Vascular:  Normal pulses all extremities.  . Visible pulsations over the right wrist radial and ulnar aspects which are compressible. Tender to touch  Neurologic Exam Mental Status: Awake and fully alert. Oriented to place and time. Recent and remote memory intact. Attention span, concentration and fund of knowledge appropriate. Mood and affect appropriate.  Cranial Nerves: Fundoscopic exam not done   Pupils equal, briskly reactive to light. Extraocular movements full without nystagmus. Visual fields full to confrontation. Hearing intact. Facial sensation intact. Face, tongue, palate moves normally and symmetrically.  Motor: Normal bulk and tone. Normal strength in all tested extremity muscles. Sensation : hyperesthesia  to tough and pinprick  Sensation in RUE more on median than ulnar aspect and vibratory sensation intact..  Coordination: Rapid alternating movements normal in all extremities. Finger-to-nose and heel-to-shin performed accurately bilaterally. Gait and Station: Arises from chair without difficulty. Stance is normal. Gait demonstrates  normal stride length and balance . Able to heel, toe and tandem walk without difficulty.  Reflexes: 1+ and symmetric. Toes downgoing.       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 Right wrist swelling and abnormal pulsations of unclear etiology.-MRI and ultrasound do not suggest vascular tumor or mass. Long-standing bilateral tinnitus with negative brain imaging and ENT evaluation appears stable. 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 Recommend follow-up MRA of the brain since his been more than 3 years since the last study. The patient continues to have right hand neuropathic pain but has not responded to trials of Topamax and Lyrica in the past and is declining any further medications for the same. Continue follow-up with her ENT physician for her sinus headaches. Greater than 50% time during this 25 minute visit was spent on counseling and coordination of care about her asymptomatic pain aneurysm, neuropathic pain, tinnitus and answering questions No scheduled routine follow-up appointment with me is necessary. Antony Contras, MD  [facility] [address] [place], [place] [address]  Phone [phone] Fax [phone] Note: This document was prepared with digital dictation and possible smart phrase technology. Any transcriptional errors that result from this process are unintentional.

## 2017-03-15 NOTE — Patient Instructions (Signed)
I had a long discussion with the patient with regards to her asymptomatic small left cavernous carotid aneurysm and discussed risk  for rupture and need for follow-up. Recommend follow-up MRA of the brain since his been more than 3 years since the last study. The patient continues to have right hand neuropathic pain but has not responded to trials of Topamax and Lyrica in the past and is declining any further medications for the same. Continue follow-up with her ENT physician for her sinus headaches. No scheduled routine follow-up appointment with me is necessary.  Cerebral Aneurysm An aneurysm is the bulging or ballooning out of part of the weakened wall of a vein or artery. An aneurysm in the vein or artery of the brain is called a brain aneurysm, or cerebral aneurysm. Aneurysms are a risk to your health because they may leak or rupture. Once the aneurysm leaks or ruptures, bleeding occurs. If the bleeding occurs within the brain tissue, the condition is called an intracerebral hemorrhage. An intracerebral hemorrhage can result in a hemorrhagic stroke. If the bleeding occurs in the area between the brain and the thin tissues that cover the brain, the condition is called a subarachnoid hemorrhage. This increases the pressure on the brain and causes some areas of the brain to not get the necessary blood flow. The blood from the ruptured aneurysm collects and presses on the surrounding brain tissue. A subarachnoid hemorrhage can cause a stroke. A ruptured cerebral aneurysm is a medical emergency. This can cause permanent damage and loss of brain function. What are the causes? A cerebral aneurysm is caused when a weakened part of the blood vessel expands. The blood vessel expands due to the constant pressure from the flow of blood through the weakened blood vessel. Usually the aneurysm expands slowly. As the weakened aneurysm expands, the walls of the aneurysm become weaker. Aneurysms may be associated with  diseases that weaken and damage the walls of your blood vessels or blood vessels that develop abnormally. Some known causes for cerebral aneurysms are:  Head trauma.  Infection.  Use of "recreational drugs" such as cocaine or amphetamines.  What increases the risk? People at risk for a cerebral aneurysm or hemorrhagic stroke usually have one or more risk factors, which include:  Having high blood pressure (hypertension).  Abusing alcohol.  Having abnormal blood vessels present since birth.  Having certain bleeding disorders, such as hemophilia, sickle cell disease, or liver disease.  Taking blood thinners (anticoagulants).  Smoking.  Having a family history of aneurysm.  What are the signs or symptoms? The signs and symptoms of an unruptured cerebral aneurysm will partly depend on its size and rate of growth. A small, unchanging aneurysm generally does not produce symptoms. A larger aneurysm that is steadily growing can increase pressure on the brain or nerves. That increased pressure from the unruptured cerebral aneurysm can cause:  A headache.  Problems with your vision.  Numbness or weakness in an arm or leg.  Problems with memory.  Problems speaking.  Seizures.  If an aneurysm leaks or bursts, it can cause a stroke and be life-threatening. Symptoms may include:  A sudden, severe headache with no known cause. The headache is often described as the worst headache ever experienced.  Nausea or vomiting, especially when combined with other symptoms such as a headache.  Sudden weakness or numbness of the face, arm, or leg, especially on one side of the body.  Sudden trouble walking or difficulty moving arms or legs.  Sudden confusion.  Sudden personality changes.  Trouble speaking (aphasia) or understanding.  Difficulty swallowing.  Sudden trouble seeing in one or both eyes.  Double vision.  Dizziness.  Loss of balance or coordination.  Intolerance to  light.  Stiff neck.  How is this diagnosed? Your health care provider may use one of the following tests to diagnose your aneurysm:  Computed tomographic angiography (CTA). This test uses dye and a scanner to produce images of your blood vessels.  Magnetic resonance angiography (MRA). This test uses an MRI machine to produce images of your blood vessels.  Digital subtraction angiography (DSA). This test uses dye and X-rays to take images of your blood vessels. Your health care provider may use this test to help determine the best course of treatment.  How is this treated? Unruptured Aneurysms Treatment is complex when an aneurysm is found and it is not causing problems. Treatment is very individualized, as each case is different. Many things must be considered, such as the size and exact location of your aneurysm, your age, your overall health, and your feelings and preferences. Small aneurysms in certain locations of the brain have a very low chance of bleeding or rupturing. These small aneurysms may not be treated. However, depending on the size and location of the aneurysm, treatments may be recommended and include:  Coiling. During this procedure, a catheter is inserted and advanced through a blood vessel. Once the catheter reaches the aneurysm, tiny coils are used to block blood flow into the aneurysm.  Surgical clipping. During surgery, a clip is placed at the base of the aneurysm. The clip prevents blood from continuing to enter the aneurysm.  Flow diversion. This procedure is used to divert blood flow around the aneurysm.  Ruptured Aneurysms Immediate emergency surgery may be needed to help prevent damage to the brain and to reduce the risk of rebleeding. Timing of treatment is an important factor in the prevention of complications. Successful early treatment of a ruptured aneurysm (within the first 3 days of a bleed) helps to prevent rebleeding and blood vessel spasm. In some cases,  there may be a reason to treat later (10-14 days after a rupture). Many things are considered when making this decision, and each case is handled individually. Follow these instructions at home:  Take medicines only as instructed by your health care provider.  Eat healthy foods. It is recommended that you eat 5 or more servings of fruits and vegetables each day. Foods may need to be a special consistency (soft or pureed), or small bites may need to be taken if you have had a ruptured aneurysm or stroke. Certain dietary changes may be advised to address high blood pressure, high cholesterol, diabetes, or obesity. ? Food choices that are low in salt (sodium), saturated fat, trans fat, and cholesterol are recommended to manage high blood pressure. ? Food choices that are high in fiber and low in saturated fat, trans fat, and cholesterol are recommended to control cholesterol levels. ? Controlling carbohydrate and sugar intake is recommended to manage diabetes. ? Reducing calorie intake and making food choices that are low in sodium, saturated fat, trans fat, and cholesterol are recommended to manage obesity.  Maintain a healthy weight.  Stay physically active. It is recommended that you get at least 30 minutes of activity on most or all days.  Do not smoke.  Limit alcohol use. Moderate alcohol use is considered to be: ? No more than 2 drinks each day for men. ?  No more than 1 drink each day for nonpregnant women.  Stop drug abuse.  A safe home environment is important to reduce the risk of falls. Your health care provider may arrange for specialists to evaluate your home. Having grab bars in the bedroom and bathroom is often important. Your health care provider may arrange for special equipment to be used at home, such as raised toilets and a seat for the shower.  Physical, occupational, and speech therapy. Ongoing therapy may be needed to maximize your recovery after a ruptured aneurysm or  stroke. If you have been advised to use a walker or a cane, use it at all times. Be sure to keep your therapy appointments.  Follow all instructions for follow-up with your health care provider. This is very important. This includes any referrals, physical therapy, rehabilitation, and laboratory tests. Proper follow-up may prevent an aneurysm rupture or a stroke. Get help right away if:  You have a sudden, severe headache with no known cause.  You have sudden nausea or vomiting with a severe headache.  You have sudden weakness or numbness of the face, arm, or leg, especially on one side of the body.  You have sudden trouble walking or difficulty moving arms or legs.  You have sudden confusion.  You have trouble speaking or understanding.  You have sudden trouble seeing in one or both eyes.  You have a sudden loss of balance or coordination.  You have a stiff neck.  You have difficulty breathing.  You have a partial or total loss of consciousness. Any of these symptoms may represent a serious problem that is an emergency. Do not wait to see if the symptoms will go away. Get medical help at once. Call your local emergency services (911 in [place] Do not drive yourself to the hospital. This information is not intended to replace advice given to you by your health care provider. Make sure you discuss any questions you have with your health care provider. Document Released: 03/11/2002 Document Revised: 11/25/2015 Document Reviewed: 12/05/2012 Elsevier Interactive Patient Education  2017 [employer].

## 2017-03-30 ENCOUNTER — Other Ambulatory Visit (HOSPITAL_COMMUNITY)
Admission: RE | Admit: 2017-03-30 | Discharge: 2017-03-30 | Disposition: A | Discharge status: Home / Self Care | Payer: Managed Care, Other (non HMO) | Source: Ambulatory Visit | Attending: Adult Health | Admitting: Adult Health

## 2017-03-30 ENCOUNTER — Ambulatory Visit (INDEPENDENT_AMBULATORY_CARE_PROVIDER_SITE_OTHER): Payer: Managed Care, Other (non HMO) | Admitting: Adult Health

## 2017-03-30 ENCOUNTER — Encounter: Payer: Self-pay | Admitting: Adult Health

## 2017-03-30 VITALS — BP 128/70 | HR 91 | Ht 63.0 in | Wt 159.0 lb

## 2017-03-30 DIAGNOSIS — Z1211 Encounter for screening for malignant neoplasm of colon: Secondary | ICD-10-CM | POA: Diagnosis not present

## 2017-03-30 DIAGNOSIS — Z1212 Encounter for screening for malignant neoplasm of rectum: Secondary | ICD-10-CM | POA: Diagnosis not present

## 2017-03-30 DIAGNOSIS — N3941 Urge incontinence: Secondary | ICD-10-CM | POA: Diagnosis not present

## 2017-03-30 DIAGNOSIS — Z01419 Encounter for gynecological examination (general) (routine) without abnormal findings: Secondary | ICD-10-CM | POA: Insufficient documentation

## 2017-03-30 DIAGNOSIS — F32A Depression, unspecified: Secondary | ICD-10-CM

## 2017-03-30 DIAGNOSIS — R319 Hematuria, unspecified: Secondary | ICD-10-CM | POA: Diagnosis not present

## 2017-03-30 DIAGNOSIS — Z01411 Encounter for gynecological examination (general) (routine) with abnormal findings: Secondary | ICD-10-CM

## 2017-03-30 DIAGNOSIS — F329 Major depressive disorder, single episode, unspecified: Secondary | ICD-10-CM | POA: Diagnosis not present

## 2017-03-30 DIAGNOSIS — Z1151 Encounter for screening for human papillomavirus (HPV): Secondary | ICD-10-CM | POA: Diagnosis not present

## 2017-03-30 LAB — POCT URINALYSIS DIPSTICK
Glucose, UA: NEGATIVE
KETONES UA: NEGATIVE
LEUKOCYTES UA: NEGATIVE
Nitrite, UA: NEGATIVE
PROTEIN UA: NEGATIVE

## 2017-03-30 LAB — HEMOCCULT GUIAC POC 1CARD (OFFICE): Fecal Occult Blood, POC: NEGATIVE

## 2017-03-30 NOTE — Progress Notes (Signed)
Patient ID: Melanie Jimenez, female   DOB: [birth year], 56 y.o.   MRN: [id number] History of Present Illness: Melanie Jimenez is a 56 year old white female, married in for well woman gyn exam and pap. PCP is Dr Nadara Mustard.    Current Medications, Allergies, Past Medical History, Past Surgical History, Family History and Social History were reviewed in [employer] record.     Review of Systems: Patient denies any  hearing loss, fatigue, blurred vision, shortness of breath, chest pain, abdominal pain, problems with bowel movements, or intercourse. No joint pain or mood swings. +tinnitus [place] block[place]  [place]UI at times  +depressed  Physical Exam:BP 128/70 (BP Location: Left Arm, Patient Position: Sitting, Cuff Size: Normal)   Pulse 91   Ht 5\' 3"  (1.6 m)   Wt 159 lb (72.1 kg)   BMI 28.17 kg/m urine 2+ blood. (has kidney stones and has blood in urine at times, has seen Dr Exie Parody in [place]).  General:  Well developed, well nourished, no acute distress Skin:  Warm and dry Neck:  Midline trachea, normal thyroid, good ROM, no lymphadenopathy Lungs; Clear to auscultation bilaterally Breast:  No dominant palpable mass, retraction, or nipple discharge Cardiovascular: Regular rate and rhythm Abdomen:  Soft, non tender, no hepatosplenomegaly Pelvic:  External genitalia is normal in appearance, no lesions.  The vagina is normal in appearance. Urethra has no lesions or masses. The cervix is bulbous.Pap with HPV performed.  Uterus is felt to be normal size, shape, and contour.  No adnexal masses or tenderness noted.Bladder is non tender, no masses felt. Rectal: Good sphincter tone, no polyps, or hemorrhoids felt.  Hemoccult negative. Extremities/musculoskeletal:  No swelling or varicosities noted, no clubbing or cyanosis Psych:  No mood changes, alert and cooperative,seems happy PHQ 9 score 19, is on meds, denies being suicidal or homicidal, and sees counselor.    Impression: 1. Encounter for gynecological examination with Papanicolaou smear of cervix   2. Hematuria, unspecified type   3. Screening for colorectal cancer   4. Urge incontinence   5. Depression, unspecified depression type       Plan: UA C&S sent Physical in 1 year Pap in 3 year Mammogram yearly Labs with PCP Colonoscopy per GI

## 2017-03-30 NOTE — Patient Instructions (Signed)
Physical in 1 year Pap in 3 year Mammogram yearly Labs with PCP Colonoscopy per GI

## 2017-03-31 LAB — URINALYSIS, ROUTINE W REFLEX MICROSCOPIC
Bilirubin, UA: NEGATIVE
GLUCOSE, UA: NEGATIVE
Ketones, UA: NEGATIVE
Leukocytes, UA: NEGATIVE
Nitrite, UA: NEGATIVE
PH UA: 5.5 (ref 5.0–7.5)
PROTEIN UA: NEGATIVE
Specific Gravity, UA: 1.021 (ref 1.005–1.030)
UUROB: 0.2 mg/dL (ref 0.2–1.0)

## 2017-03-31 LAB — MICROSCOPIC EXAMINATION: Casts: NONE SEEN /lpf

## 2017-04-01 LAB — URINE CULTURE

## 2017-04-02 ENCOUNTER — Ambulatory Visit
Admission: RE | Admit: 2017-04-02 | Discharge: 2017-04-02 | Disposition: A | Discharge status: Home / Self Care | Payer: Managed Care, Other (non HMO) | Source: Ambulatory Visit | Attending: Neurology | Admitting: Neurology

## 2017-04-02 ENCOUNTER — Telehealth: Payer: Self-pay | Admitting: Adult Health

## 2017-04-02 DIAGNOSIS — I671 Cerebral aneurysm, nonruptured: Secondary | ICD-10-CM

## 2017-04-02 MED ORDER — SULFAMETHOXAZOLE-TRIMETHOPRIM 800-160 MG PO TABS: 1.0000 | ORAL_TABLET | Freq: Two times a day (BID) | ORAL | 0 refills | Status: DC

## 2017-04-02 NOTE — Telephone Encounter (Signed)
Left message that culture + E coli, will rx septra ds 1 bid x 7 days, to [facility] [place]

## 2017-04-03 LAB — CYTOLOGY - PAP
ADEQUACY: ABSENT
Diagnosis: NEGATIVE
HPV: NOT DETECTED

## 2017-04-09 ENCOUNTER — Telehealth: Payer: Self-pay | Admitting: Neurology

## 2017-04-09 NOTE — Telephone Encounter (Signed)
Rn call patient about her MRA of brain. The brain shows stable appearance of the tiny cavernous carotid aneurysm. No new or worrisome findings. Pt verbalized understanding. ------

## 2017-04-09 NOTE — Telephone Encounter (Signed)
Pt request MRI results. Can LVM if she is not available.

## 2017-04-11 ENCOUNTER — Telehealth: Payer: Self-pay

## 2017-04-11 NOTE — Telephone Encounter (Signed)
Left vm for patient that she turn in a long term disability form from her job. Pt was last seen 08/05/2014. She return for a visit on 03/15/2017,and was cleared back to her PCP. Her reviewed her chart, and form. PEr Dr. Leonie Man he stated patient needs to forward the form to the MD who has been doing her disability form. The form is for long term disability. From his evaluation he cannot do the form. LEft vm that form will be at the front desk for her to pick up. The form cannot be done.

## 2017-04-16 ENCOUNTER — Ambulatory Visit (INDEPENDENT_AMBULATORY_CARE_PROVIDER_SITE_OTHER): Payer: Managed Care, Other (non HMO) | Admitting: Otolaryngology

## 2017-04-16 DIAGNOSIS — H903 Sensorineural hearing loss, bilateral: Secondary | ICD-10-CM

## 2017-04-16 DIAGNOSIS — H9313 Tinnitus, bilateral: Secondary | ICD-10-CM

## 2017-04-16 DIAGNOSIS — J342 Deviated nasal septum: Secondary | ICD-10-CM | POA: Diagnosis not present

## 2017-04-16 DIAGNOSIS — J343 Hypertrophy of nasal turbinates: Secondary | ICD-10-CM | POA: Diagnosis not present

## 2017-04-16 DIAGNOSIS — J320 Chronic maxillary sinusitis: Secondary | ICD-10-CM | POA: Diagnosis not present

## 2017-05-01 ENCOUNTER — Other Ambulatory Visit (INDEPENDENT_AMBULATORY_CARE_PROVIDER_SITE_OTHER): Payer: Self-pay | Admitting: Otolaryngology

## 2017-05-01 DIAGNOSIS — J320 Chronic maxillary sinusitis: Secondary | ICD-10-CM

## 2017-05-08 ENCOUNTER — Ambulatory Visit (HOSPITAL_COMMUNITY)
Admission: RE | Admit: 2017-05-08 | Discharge: 2017-05-08 | Disposition: A | Discharge status: Home / Self Care | Payer: Managed Care, Other (non HMO) | Source: Ambulatory Visit | Attending: Otolaryngology | Admitting: Otolaryngology

## 2017-05-08 DIAGNOSIS — J320 Chronic maxillary sinusitis: Secondary | ICD-10-CM | POA: Insufficient documentation

## 2017-05-08 DIAGNOSIS — R600 Localized edema: Secondary | ICD-10-CM | POA: Diagnosis not present

## 2017-05-14 ENCOUNTER — Ambulatory Visit (INDEPENDENT_AMBULATORY_CARE_PROVIDER_SITE_OTHER): Payer: Managed Care, Other (non HMO) | Admitting: Otolaryngology

## 2017-05-14 DIAGNOSIS — H9313 Tinnitus, bilateral: Secondary | ICD-10-CM

## 2017-05-14 DIAGNOSIS — J342 Deviated nasal septum: Secondary | ICD-10-CM | POA: Diagnosis not present

## 2017-05-14 DIAGNOSIS — J310 Chronic rhinitis: Secondary | ICD-10-CM

## 2017-05-14 DIAGNOSIS — R51 Headache: Secondary | ICD-10-CM | POA: Diagnosis not present

## 2017-10-20 IMAGING — NM NM MYOCAR MULTI W/SPECT W/WALL MOTION & EF
2 series · 12 of 12 positions shown · non-contrast
Comparison: none

[Series 1: rest · 8.28mm/px · 6 of 64 frames shown]
[frame 6/64]
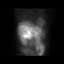
[frame 16/64]
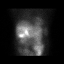
[frame 27/64]
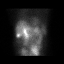
[frame 38/64]
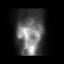
[frame 48/64]
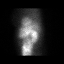
[frame 59/64]
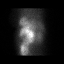

[Series 2: stress gated · 8.28mm/px · 6 of 64 frames shown]
[frame 6/64]
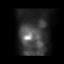
[frame 16/64]
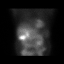
[frame 27/64]
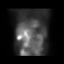
[frame 38/64]
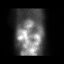
[frame 48/64]
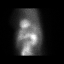
[frame 59/64]
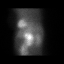

[12 of 12 positions shown; findings below may reference images not displayed]

Canned report from images found in remote index.

Refer to host system for actual result text.

## 2018-02-07 ENCOUNTER — Other Ambulatory Visit: Payer: Self-pay | Admitting: Adult Health

## 2018-02-07 DIAGNOSIS — Z1231 Encounter for screening mammogram for malignant neoplasm of breast: Secondary | ICD-10-CM

## 2018-03-13 ENCOUNTER — Encounter (HOSPITAL_COMMUNITY): Payer: Self-pay

## 2018-03-13 ENCOUNTER — Ambulatory Visit (HOSPITAL_COMMUNITY)
Admission: RE | Admit: 2018-03-13 | Discharge: 2018-03-13 | Disposition: A | Discharge status: Home / Self Care | Payer: Managed Care, Other (non HMO) | Source: Ambulatory Visit | Attending: Adult Health | Admitting: Adult Health

## 2018-03-13 DIAGNOSIS — Z1231 Encounter for screening mammogram for malignant neoplasm of breast: Secondary | ICD-10-CM

## 2018-05-22 ENCOUNTER — Encounter: Payer: Self-pay | Admitting: Adult Health

## 2018-05-22 ENCOUNTER — Ambulatory Visit (INDEPENDENT_AMBULATORY_CARE_PROVIDER_SITE_OTHER): Payer: Managed Care, Other (non HMO) | Admitting: Adult Health

## 2018-05-22 VITALS — BP 131/81 | HR 97 | Ht 63.0 in | Wt 159.0 lb

## 2018-05-22 DIAGNOSIS — B369 Superficial mycosis, unspecified: Secondary | ICD-10-CM

## 2018-05-22 DIAGNOSIS — Z1211 Encounter for screening for malignant neoplasm of colon: Secondary | ICD-10-CM | POA: Diagnosis not present

## 2018-05-22 DIAGNOSIS — R319 Hematuria, unspecified: Secondary | ICD-10-CM

## 2018-05-22 DIAGNOSIS — Z01419 Encounter for gynecological examination (general) (routine) without abnormal findings: Secondary | ICD-10-CM

## 2018-05-22 DIAGNOSIS — Z1212 Encounter for screening for malignant neoplasm of rectum: Secondary | ICD-10-CM | POA: Diagnosis not present

## 2018-05-22 LAB — POCT URINALYSIS DIPSTICK OB
GLUCOSE, UA: NEGATIVE
Ketones, UA: NEGATIVE
LEUKOCYTES UA: NEGATIVE
Nitrite, UA: NEGATIVE
POC,PROTEIN,UA: NEGATIVE

## 2018-05-22 LAB — HEMOCCULT GUIAC POC 1CARD (OFFICE): FECAL OCCULT BLD: NEGATIVE

## 2018-05-22 MED ORDER — NYSTATIN 100000 UNIT/GM EX POWD: Freq: Four times a day (QID) | CUTANEOUS | 0 refills | Status: DC

## 2018-05-22 NOTE — Progress Notes (Signed)
Patient ID: Melanie Jimenez, female   DOB: 19-Jan-1961, 57 y.o.   MRN: [id number] History of Present Illness: Melanie Jimenez is a 57 year old white female, married, PM in for a well woman gyn exam, she had a normal pap with negative HPV 03/30/17. PCP is Dr Melanie Jimenez.    Current Medications, Allergies, Past Medical History, Past Surgical History, Family History and Social History were reviewed in [employer] record.     Review of Systems:  .Patient denies any hearing loss, fatigue, blurred vision, shortness of breath, chest pain, abdominal pain, problems with bowel movements, urination, or intercourse. No joint pain or mood swings. +sweats in groin area. Craving sweets lately. Has bilateral tinnitus and has not worked in over a year, has headaches and not does not sleep well secondary to it.   Physical Exam: BP 131/81 (BP Location: Left Arm, Patient Position: Sitting, Cuff Size: Normal)   Pulse 97   Ht 5\' 3"  (1.6 m)   Wt 159 lb (72.1 kg)   BMI 28.17 kg/m  Urine dipstick is +large blood.(she says she has had kidney stones in the past) General:  Well developed, well nourished, no acute distress Skin:  Warm and dry Neck:  Midline trachea, normal thyroid, good ROM, no lymphadenopathy Lungs; Clear to auscultation bilaterally Breast:  No dominant palpable mass, retraction, or nipple discharge Cardiovascular: Regular rate and rhythm Abdomen:  Soft, non tender, no hepatosplenomegaly Pelvic:  External genitalia is normal in appearance, no lesions,some discoloration in groin, from recent yeast.  The vagina is normal in appearance. Urethra has no lesions or masses. The cervix is smooth.  Uterus is felt to be normal size, shape, and contour.  No adnexal masses or tenderness noted.Bladder is non tender, no masses felt. Rectal: Good sphincter tone, no polyps, or hemorrhoids felt.  Hemoccult negative. Extremities/musculoskeletal:  No swelling or varicosities noted, no clubbing or  cyanosis Psych:  No mood changes, alert and cooperative,seems happy PHQ 9 score 15, is on meds and denies being suicidal.  Examination chaperoned by Melanie Jimenez Rash, LPN.  Impression: 1. Encounter for well woman exam with routine gynecological exam   2. Screening for colorectal cancer   3. Hematuria, unspecified type   4. Superficial fungus infection of skin       Plan: UA C&S sent Meds ordered this encounter  Medications  . nystatin (MYCOSTATIN/NYSTOP) powder    Sig: Apply topically 4 (four) times daily.    Dispense:  15 g    Refill:  0    Order Specific Question:   Supervising Provider    Answer:   Melanie Jimenez [[id number]]   Physical in 1 year Pap in 2021 Mammogram yearly Colonoscopy per GI  Labs with PCP Try taking Magnesium 250 mg

## 2018-05-23 LAB — URINALYSIS, ROUTINE W REFLEX MICROSCOPIC
BILIRUBIN UA: NEGATIVE
Glucose, UA: NEGATIVE
Ketones, UA: NEGATIVE
NITRITE UA: NEGATIVE
PH UA: 6 (ref 5.0–7.5)
PROTEIN UA: NEGATIVE
Specific Gravity, UA: 1.017 (ref 1.005–1.030)
UUROB: 0.2 mg/dL (ref 0.2–1.0)

## 2018-05-23 LAB — MICROSCOPIC EXAMINATION
BACTERIA UA: NONE SEEN
CASTS: NONE SEEN /LPF

## 2018-05-24 ENCOUNTER — Other Ambulatory Visit: Payer: Self-pay | Admitting: Adult Health

## 2018-05-24 DIAGNOSIS — R319 Hematuria, unspecified: Secondary | ICD-10-CM

## 2018-05-24 LAB — URINE CULTURE

## 2018-05-24 NOTE — Progress Notes (Signed)
Referred to [facility] for blood in urine

## 2018-08-29 ENCOUNTER — Ambulatory Visit (INDEPENDENT_AMBULATORY_CARE_PROVIDER_SITE_OTHER): Payer: Managed Care, Other (non HMO) | Admitting: Otolaryngology

## 2018-08-29 DIAGNOSIS — H6123 Impacted cerumen, bilateral: Secondary | ICD-10-CM

## 2018-08-29 DIAGNOSIS — H9313 Tinnitus, bilateral: Secondary | ICD-10-CM

## 2018-08-29 DIAGNOSIS — H9201 Otalgia, right ear: Secondary | ICD-10-CM | POA: Diagnosis not present

## 2018-08-29 DIAGNOSIS — H903 Sensorineural hearing loss, bilateral: Secondary | ICD-10-CM

## 2019-02-10 ENCOUNTER — Other Ambulatory Visit (HOSPITAL_COMMUNITY): Payer: Self-pay | Admitting: Adult Health

## 2019-02-10 DIAGNOSIS — Z1231 Encounter for screening mammogram for malignant neoplasm of breast: Secondary | ICD-10-CM

## 2019-03-19 ENCOUNTER — Other Ambulatory Visit: Payer: Self-pay

## 2019-03-19 ENCOUNTER — Ambulatory Visit (HOSPITAL_COMMUNITY)
Admission: RE | Admit: 2019-03-19 | Discharge: 2019-03-19 | Disposition: A | Discharge status: Home / Self Care | Payer: Managed Care, Other (non HMO) | Source: Ambulatory Visit | Attending: Adult Health | Admitting: Adult Health

## 2019-03-19 DIAGNOSIS — Z1231 Encounter for screening mammogram for malignant neoplasm of breast: Secondary | ICD-10-CM | POA: Diagnosis not present

## 2019-05-27 ENCOUNTER — Other Ambulatory Visit: Payer: Self-pay

## 2019-05-27 DIAGNOSIS — Z20822 Contact with and (suspected) exposure to covid-19: Secondary | ICD-10-CM

## 2019-05-28 ENCOUNTER — Telehealth: Payer: Self-pay | Admitting: *Deleted

## 2019-05-28 LAB — NOVEL CORONAVIRUS, NAA: SARS-CoV-2, NAA: NOT DETECTED

## 2019-05-28 NOTE — Telephone Encounter (Signed)
Pt calling for covid results; active. Pt aware not resulted yet. Verbalizes understanding.

## 2019-05-30 ENCOUNTER — Telehealth: Payer: Self-pay | Admitting: Family Medicine

## 2019-05-30 NOTE — Telephone Encounter (Signed)
° °  Pt rec neg COVID results °

## 2019-07-11 DIAGNOSIS — F064 Anxiety disorder due to known physiological condition: Secondary | ICD-10-CM | POA: Diagnosis not present

## 2019-07-17 ENCOUNTER — Ambulatory Visit (INDEPENDENT_AMBULATORY_CARE_PROVIDER_SITE_OTHER): Payer: PPO | Admitting: Adult Health

## 2019-07-17 ENCOUNTER — Other Ambulatory Visit: Payer: Self-pay

## 2019-07-17 ENCOUNTER — Encounter: Payer: Self-pay | Admitting: Adult Health

## 2019-07-17 ENCOUNTER — Other Ambulatory Visit (HOSPITAL_COMMUNITY)
Admission: RE | Admit: 2019-07-17 | Discharge: 2019-07-17 | Disposition: A | Discharge status: Home / Self Care | Payer: PPO | Source: Ambulatory Visit | Attending: Adult Health | Admitting: Adult Health

## 2019-07-17 VITALS — BP 112/76 | HR 89 | Ht 63.0 in | Wt 160.0 lb

## 2019-07-17 DIAGNOSIS — Z01419 Encounter for gynecological examination (general) (routine) without abnormal findings: Secondary | ICD-10-CM

## 2019-07-17 DIAGNOSIS — R319 Hematuria, unspecified: Secondary | ICD-10-CM

## 2019-07-17 DIAGNOSIS — H9313 Tinnitus, bilateral: Secondary | ICD-10-CM

## 2019-07-17 DIAGNOSIS — Z124 Encounter for screening for malignant neoplasm of cervix: Secondary | ICD-10-CM | POA: Diagnosis not present

## 2019-07-17 DIAGNOSIS — Z1212 Encounter for screening for malignant neoplasm of rectum: Secondary | ICD-10-CM

## 2019-07-17 DIAGNOSIS — Z1211 Encounter for screening for malignant neoplasm of colon: Secondary | ICD-10-CM

## 2019-07-17 DIAGNOSIS — Z1151 Encounter for screening for human papillomavirus (HPV): Secondary | ICD-10-CM | POA: Diagnosis not present

## 2019-07-17 LAB — POCT URINALYSIS DIPSTICK
Glucose, UA: NEGATIVE
Ketones, UA: NEGATIVE
Nitrite, UA: NEGATIVE
Protein, UA: NEGATIVE

## 2019-07-17 LAB — HEMOCCULT GUIAC POC 1CARD (OFFICE): Fecal Occult Blood, POC: NEGATIVE

## 2019-07-17 NOTE — Progress Notes (Signed)
Patient ID: Melanie Jimenez, female   DOB: 1961-03-05, 59 y.o.   MRN: [id number] History of Present Illness: Melanie Jimenez is a 59 year old white female, married, PM in for a well woman gyn exam and pap. PCP is [facility].   Current Medications, Allergies, Past Medical History, Past Surgical History, Family History and Social History were reviewed in [employer] record.     Review of Systems: Patient denies any  hearing loss, fatigue, blurred vision, shortness of breath, chest pain, abdominal pain, problems with bowel movements, urination, or intercourse. No joint pain or mood swings. +tinnitis, for over 6 years and is on disability. Ha headaches.     Physical Exam:BP 112/76 (BP Location: Left Arm, Patient Position: Sitting, Cuff Size: Normal)   Pulse 89   Ht 5\' 3"  (1.6 m)   Wt 160 lb (72.6 kg)   BMI 28.34 kg/m urine 2+ leuks and 2+ blood , saw [facility] and has kidney stones.  General:  Well developed, well nourished, no acute distress Skin:  Warm and dry Neck:  Midline trachea, normal thyroid, good ROM, no lymphadenopathy Lungs; Clear to auscultation bilaterally Breast:  No dominant palpable mass, retraction, or nipple discharge Cardiovascular: Regular rate and rhythm Abdomen:  Soft, non tender, no hepatosplenomegaly Pelvic:  External genitalia is normal in appearance, no lesions.  The vagina is normal in appearance. Urethra has no lesions or masses. The cervix is smooth, pap with high risk HPV 16/18 genotyping.  Uterus is felt to be normal size, shape, and contour.  No adnexal masses or tenderness noted.Bladder is non tender, no masses felt. Rectal: Good sphincter tone, no polyps, or hemorrhoids felt.  Hemoccult negative. Extremities/musculoskeletal:  No swelling or varicosities noted, no clubbing or cyanosis Psych:  No mood changes, alert and cooperative,seems happy Fall risk is low PHQ 9 scoe is 16, denies being suicidal is on meds. Examination chaperoned by  Levy Pupa LPN  Impression and Plan: 1. Routine cervical smear  2. Hematuria, unspecified type  3. Encounter for gynecological examination with Papanicolaou smear of cervix Pap sent Pap and physical in 2 years Get mammogram  Labs with PCP  4. Screening for colorectal cancer colonoscopy per GI  5. Tinnitus of both ears Sees Dr Benjamine Mola and [facility]

## 2019-07-21 LAB — CYTOLOGY - PAP
Comment: NEGATIVE
Diagnosis: NEGATIVE
High risk HPV: NEGATIVE

## 2019-07-22 DIAGNOSIS — M25562 Pain in left knee: Secondary | ICD-10-CM | POA: Diagnosis not present

## 2019-08-04 DIAGNOSIS — L728 Other follicular cysts of the skin and subcutaneous tissue: Secondary | ICD-10-CM | POA: Diagnosis not present

## 2019-08-04 DIAGNOSIS — L720 Epidermal cyst: Secondary | ICD-10-CM | POA: Diagnosis not present

## 2019-08-04 DIAGNOSIS — D225 Melanocytic nevi of trunk: Secondary | ICD-10-CM | POA: Diagnosis not present

## 2019-08-12 DIAGNOSIS — M25562 Pain in left knee: Secondary | ICD-10-CM | POA: Diagnosis not present

## 2019-08-12 DIAGNOSIS — M25462 Effusion, left knee: Secondary | ICD-10-CM | POA: Diagnosis not present

## 2019-08-18 DIAGNOSIS — K219 Gastro-esophageal reflux disease without esophagitis: Secondary | ICD-10-CM | POA: Diagnosis not present

## 2019-08-18 DIAGNOSIS — Z720 Tobacco use: Secondary | ICD-10-CM | POA: Diagnosis not present

## 2019-08-18 DIAGNOSIS — R5382 Chronic fatigue, unspecified: Secondary | ICD-10-CM | POA: Diagnosis not present

## 2019-08-18 DIAGNOSIS — F411 Generalized anxiety disorder: Secondary | ICD-10-CM | POA: Diagnosis not present

## 2019-08-18 DIAGNOSIS — E7800 Pure hypercholesterolemia, unspecified: Secondary | ICD-10-CM | POA: Diagnosis not present

## 2019-08-18 DIAGNOSIS — R739 Hyperglycemia, unspecified: Secondary | ICD-10-CM | POA: Diagnosis not present

## 2019-08-20 DIAGNOSIS — Z6828 Body mass index (BMI) 28.0-28.9, adult: Secondary | ICD-10-CM | POA: Diagnosis not present

## 2019-08-20 DIAGNOSIS — R5382 Chronic fatigue, unspecified: Secondary | ICD-10-CM | POA: Diagnosis not present

## 2019-08-20 DIAGNOSIS — F331 Major depressive disorder, recurrent, moderate: Secondary | ICD-10-CM | POA: Diagnosis not present

## 2019-08-20 DIAGNOSIS — Z87442 Personal history of urinary calculi: Secondary | ICD-10-CM | POA: Diagnosis not present

## 2019-08-20 DIAGNOSIS — H9313 Tinnitus, bilateral: Secondary | ICD-10-CM | POA: Diagnosis not present

## 2019-08-20 DIAGNOSIS — R739 Hyperglycemia, unspecified: Secondary | ICD-10-CM | POA: Diagnosis not present

## 2019-09-02 DIAGNOSIS — Z20828 Contact with and (suspected) exposure to other viral communicable diseases: Secondary | ICD-10-CM | POA: Diagnosis not present

## 2019-09-02 DIAGNOSIS — J019 Acute sinusitis, unspecified: Secondary | ICD-10-CM | POA: Diagnosis not present

## 2019-11-06 DIAGNOSIS — F331 Major depressive disorder, recurrent, moderate: Secondary | ICD-10-CM | POA: Diagnosis not present

## 2019-12-17 DIAGNOSIS — Z6827 Body mass index (BMI) 27.0-27.9, adult: Secondary | ICD-10-CM | POA: Diagnosis not present

## 2019-12-17 DIAGNOSIS — J0100 Acute maxillary sinusitis, unspecified: Secondary | ICD-10-CM | POA: Diagnosis not present

## 2019-12-22 DIAGNOSIS — F331 Major depressive disorder, recurrent, moderate: Secondary | ICD-10-CM | POA: Diagnosis not present

## 2020-01-19 DIAGNOSIS — Z6828 Body mass index (BMI) 28.0-28.9, adult: Secondary | ICD-10-CM | POA: Diagnosis not present

## 2020-01-19 DIAGNOSIS — M792 Neuralgia and neuritis, unspecified: Secondary | ICD-10-CM | POA: Diagnosis not present

## 2020-01-27 DIAGNOSIS — H9313 Tinnitus, bilateral: Secondary | ICD-10-CM | POA: Diagnosis not present

## 2020-01-27 DIAGNOSIS — H6123 Impacted cerumen, bilateral: Secondary | ICD-10-CM | POA: Diagnosis not present

## 2020-01-27 DIAGNOSIS — H903 Sensorineural hearing loss, bilateral: Secondary | ICD-10-CM | POA: Diagnosis not present

## 2020-02-12 DIAGNOSIS — E7800 Pure hypercholesterolemia, unspecified: Secondary | ICD-10-CM | POA: Diagnosis not present

## 2020-02-12 DIAGNOSIS — R5382 Chronic fatigue, unspecified: Secondary | ICD-10-CM | POA: Diagnosis not present

## 2020-02-12 DIAGNOSIS — K219 Gastro-esophageal reflux disease without esophagitis: Secondary | ICD-10-CM | POA: Diagnosis not present

## 2020-02-12 DIAGNOSIS — Z720 Tobacco use: Secondary | ICD-10-CM | POA: Diagnosis not present

## 2020-02-12 DIAGNOSIS — R739 Hyperglycemia, unspecified: Secondary | ICD-10-CM | POA: Diagnosis not present

## 2020-02-17 DIAGNOSIS — Z87442 Personal history of urinary calculi: Secondary | ICD-10-CM | POA: Diagnosis not present

## 2020-02-17 DIAGNOSIS — Z1331 Encounter for screening for depression: Secondary | ICD-10-CM | POA: Diagnosis not present

## 2020-02-17 DIAGNOSIS — F331 Major depressive disorder, recurrent, moderate: Secondary | ICD-10-CM | POA: Diagnosis not present

## 2020-02-17 DIAGNOSIS — Z1389 Encounter for screening for other disorder: Secondary | ICD-10-CM | POA: Diagnosis not present

## 2020-02-17 DIAGNOSIS — H9313 Tinnitus, bilateral: Secondary | ICD-10-CM | POA: Diagnosis not present

## 2020-02-17 DIAGNOSIS — M792 Neuralgia and neuritis, unspecified: Secondary | ICD-10-CM | POA: Diagnosis not present

## 2020-02-17 DIAGNOSIS — R739 Hyperglycemia, unspecified: Secondary | ICD-10-CM | POA: Diagnosis not present

## 2020-02-24 ENCOUNTER — Other Ambulatory Visit (HOSPITAL_COMMUNITY): Payer: Self-pay | Admitting: Adult Health

## 2020-02-24 DIAGNOSIS — Z1231 Encounter for screening mammogram for malignant neoplasm of breast: Secondary | ICD-10-CM

## 2020-02-27 DIAGNOSIS — M654 Radial styloid tenosynovitis [de Quervain]: Secondary | ICD-10-CM | POA: Diagnosis not present

## 2020-03-22 ENCOUNTER — Other Ambulatory Visit: Payer: Self-pay

## 2020-03-22 ENCOUNTER — Ambulatory Visit (HOSPITAL_COMMUNITY)
Admission: RE | Admit: 2020-03-22 | Discharge: 2020-03-22 | Disposition: A | Discharge status: Home / Self Care | Payer: PPO | Source: Ambulatory Visit | Attending: Adult Health | Admitting: Adult Health

## 2020-03-22 DIAGNOSIS — Z1231 Encounter for screening mammogram for malignant neoplasm of breast: Secondary | ICD-10-CM | POA: Diagnosis not present

## 2020-03-25 DIAGNOSIS — M654 Radial styloid tenosynovitis [de Quervain]: Secondary | ICD-10-CM | POA: Diagnosis not present

## 2020-04-02 DIAGNOSIS — F064 Anxiety disorder due to known physiological condition: Secondary | ICD-10-CM | POA: Diagnosis not present

## 2020-04-15 DIAGNOSIS — M654 Radial styloid tenosynovitis [de Quervain]: Secondary | ICD-10-CM | POA: Diagnosis not present

## 2020-04-15 DIAGNOSIS — M25531 Pain in right wrist: Secondary | ICD-10-CM | POA: Diagnosis not present

## 2020-05-05 DIAGNOSIS — M25531 Pain in right wrist: Secondary | ICD-10-CM | POA: Diagnosis not present

## 2020-05-11 DIAGNOSIS — M25531 Pain in right wrist: Secondary | ICD-10-CM | POA: Diagnosis not present

## 2020-05-11 DIAGNOSIS — M654 Radial styloid tenosynovitis [de Quervain]: Secondary | ICD-10-CM | POA: Diagnosis not present

## 2020-05-12 ENCOUNTER — Other Ambulatory Visit: Payer: Self-pay

## 2020-05-12 ENCOUNTER — Encounter (HOSPITAL_COMMUNITY): Payer: Self-pay | Admitting: Emergency Medicine

## 2020-05-12 ENCOUNTER — Ambulatory Visit (HOSPITAL_COMMUNITY)
Admission: EM | Admit: 2020-05-12 | Discharge: 2020-05-12 | Disposition: A | Discharge status: Home / Self Care | Payer: PPO | Attending: Psychiatry | Admitting: Psychiatry

## 2020-05-12 DIAGNOSIS — R451 Restlessness and agitation: Secondary | ICD-10-CM | POA: Insufficient documentation

## 2020-05-12 DIAGNOSIS — R450 Nervousness: Secondary | ICD-10-CM | POA: Insufficient documentation

## 2020-05-12 DIAGNOSIS — F339 Major depressive disorder, recurrent, unspecified: Secondary | ICD-10-CM

## 2020-05-12 DIAGNOSIS — F411 Generalized anxiety disorder: Secondary | ICD-10-CM | POA: Diagnosis not present

## 2020-05-12 DIAGNOSIS — F329 Major depressive disorder, single episode, unspecified: Secondary | ICD-10-CM | POA: Diagnosis not present

## 2020-05-12 DIAGNOSIS — Z1331 Encounter for screening for depression: Secondary | ICD-10-CM | POA: Diagnosis not present

## 2020-05-12 DIAGNOSIS — Z6828 Body mass index (BMI) 28.0-28.9, adult: Secondary | ICD-10-CM | POA: Diagnosis not present

## 2020-05-12 DIAGNOSIS — F332 Major depressive disorder, recurrent severe without psychotic features: Secondary | ICD-10-CM | POA: Insufficient documentation

## 2020-05-12 DIAGNOSIS — F331 Major depressive disorder, recurrent, moderate: Secondary | ICD-10-CM | POA: Diagnosis not present

## 2020-05-12 DIAGNOSIS — F419 Anxiety disorder, unspecified: Secondary | ICD-10-CM | POA: Diagnosis not present

## 2020-05-12 DIAGNOSIS — Z1389 Encounter for screening for other disorder: Secondary | ICD-10-CM | POA: Diagnosis not present

## 2020-05-12 DIAGNOSIS — H9319 Tinnitus, unspecified ear: Secondary | ICD-10-CM | POA: Insufficient documentation

## 2020-05-12 NOTE — BH Assessment (Signed)
Comprehensive Clinical Assessment (CCA) Note  05/12/2020 WHITNI PASQUINI [id number]  Chief Complaint: No chief complaint on file.  Visit Diagnosis: Depression, recurrent; Generalized anxiety disorder   Melanie Jimenez is a 59yo female presenting as a walk in to [facility] for evaluation of depression symptoms. Pt is currently a patient of Dr. Casimiro Needle and has had several medication changes, the most recent change on 10/25.  Pt was switched from Effexor to [facility], and is also currently taking 4-5mg  of Ativan a day. Pt states "one day it was so bad I took 5 ativan at once just to calm myself down". Pt reports severe depression and anxiety symptoms along with insomnia. Pt reports that she is in chronic pain (right wrist) and has tinnitus in both ears that cause her to have a constant ringing in her ears. Pt has a history of multiple antidepressant medication trials, most recent switch from Effexor to [facility]. Pt feels that current medication not working at all to manage depression or anxiety symptoms. Pt also taking trazodone to help sleep. Pt currently on disability (3 yrs ago).Pt denies any current SI, HI, or AVH.  Pt reports that she wants medication stabilized and would like other outpatient psychiatric resources.  Pt denies any acute external stressors--just wants to be on the right medication to manage symptoms   Jeanmarie Plant, MSW, LCSW Outpatient Therapist/Triage Specialist   Disposition: Per Trinna Post PA pt cleared psychiatrically for discharge  CCA Screening, Triage and Referral (STR)  Patient Reported Information How did you hear about [place]? Primary Care  Referral name: Kassie Mends  Referral phone number: No data recorded  Whom do you see for routine medical problems? Primary Care  Practice/Facility Name: [facility]  Practice/Facility Phone Number: [phone]  Name of Contact: Amy Boyd  Contact Number: No data recorded Contact Fax Number: No data recorded Prescriber Name:  No data recorded Prescriber Address (if known): No data recorded  What Is the Reason for Your Visit/Call Today? Depression evalution  How Long Has This Been Causing You Problems? > than 6 months  What Do You Feel Would Help You the Most Today? Assessment Only;Medication;Therapy   Have You Recently Been in Any Inpatient Treatment (Hospital/Detox/Crisis Center/28-Day Program)? No  Name/Location of Program/Hospital:No data recorded How Long Were You There? No data recorded When Were You Discharged? No data recorded  Have You Ever Received Services From [facility] Before? No  Who Do You See at [facility]? No data recorded  Have You Recently Had Any Thoughts About Hurting Yourself? Yes (Thoughts a year ago with no plans and no intent to follow through)  Are You Planning to [place] At This time? No   Have you Recently Had Thoughts About [place]? No  Explanation: No data recorded  Have You Used Any Alcohol or Drugs in the Past 24 Hours? No  How Long Ago Did You Use Drugs or Alcohol? No data recorded What Did You Use and How Much? No data recorded  Do You Currently Have a Therapist/Psychiatrist? Yes  Name of Therapist/Psychiatrist: Dr[facility]iro [facility] Recently Discharged From Any Office Practice or Programs? No  Explanation of Discharge From Practice/Program: No data recorded    CCA Screening Triage Referral Assessment Type of Contact: Face-to-Face  Is this Initial or Reassessment? No data recorded Date Telepsych consult ordered in [facility]:  No data recorded Time Telepsych consult ordered in [facility]:  No data recorded  Patient Reported Information Reviewed? Yes  Patient Left Without Being Seen? No data recorded Reason for Not Completing Assessment: No data recorded  Collateral Involvement: Husband:  concerned about pts overall emotional well being after recent medication change 10/25   Does Patient Have a  [place]? No data recorded Name and Contact of Legal Guardian: No data recorded If Minor and Not Living with Parent(s), Who has Custody? No data recorded Is CPS involved or ever been involved? Never  Is APS involved or ever been involved? Never   Patient Determined To Be At Risk for Harm To Self or Others Based on Review of Patient Reported Information or Presenting Complaint? No  Method: No data recorded Availability of Means: No data recorded Intent: No data recorded Notification Required: No data recorded Additional Information for Danger to Others Potential: No data recorded Additional Comments for Danger to Others Potential: No data recorded Are There Guns or Other Weapons in Your Home? No data recorded Types of Guns/Weapons: No data recorded Are These Weapons Safely Secured?                            No data recorded Who Could Verify You Are Able To Have These Secured: No data recorded Do You Have any Outstanding Charges, Pending Court Dates, Parole/Probation? No data recorded Contacted To Inform of Risk of Harm To Self or Others: No data recorded  Location of Assessment: [facility] Assessment Services   Does Patient Present under Involuntary Commitment? No  IVC Papers Initial File Date: No data recorded  [place] of Residence: [place]   Patient Currently Receiving the Following Services: Medication Management   Determination of Need: No data recorded  Options For Referral: Medication Management;Outpatient Therapy     CCA Biopsychosocial Intake/Chief Complaint:  Melanie Jimenez is a 59yo female presenting as a walk in to [facility] for evaluation of depression symptoms. Pt is currently a patient of Dr. Casimiro Needle and has had several medication changes, the most recent change on 10/25.  Pt was switched from Effexor to [facility], and is also currently taking 4-5mg  of Ativan a day. Pt states "one day it was so bad I took 5 ativan at once just to calm myself down". Pt  reports severe depression and anxiety symptoms along with insomnia. Pt reports that she is in chronic pain (right wrist) and has tinnitus in both ears that cause her to have a constant ringing in her ears. Pt has a history of multiple antidepressant medication trials, most recent switch from Effexor to [facility]. Pt feels that current medication not working at all to manage depression or anxiety symptoms. Pt also taking trazodone to help sleep. Pt currently on disability (3 yrs ago).Pt denies any current SI, HI, or AVH.  Pt reports that she wants medication stabilized and would like other outpatient psychiatric resources.  Pt denies any acute external stressors--just wants to be on the right medication to manage symptoms  Current Symptoms/Problems: depression, anxiety   Patient Reported Schizophrenia/Schizoaffective Diagnosis in Past: No   Strengths: good family support  Preferences: No data recorded Abilities: No data recorded  Type of Services Patient Feels are Needed: medication management; psychiatric services   Initial Clinical Notes/Concerns: No data recorded  Mental Health Symptoms Depression:  Change in energy/activity;Sleep (too much or little);Difficulty Concentrating;Tearfulness;Fatigue;Increase/decrease in appetite;Hopelessness;Worthlessness   Duration of Depressive symptoms: Greater than two weeks   Mania:  Racing thoughts   Anxiety:   Worrying;Tension;Sleep;Restlessness;Irritability;Fatigue;Difficulty concentrating   Psychosis:  None  Duration of Psychotic symptoms: No data recorded  Trauma:  None   Obsessions:  None (Pt reports "I have OCD")   Compulsions:  None   Inattention:  N/A   Hyperactivity/Impulsivity:  N/A   Oppositional/Defiant Behaviors:  N/A   Emotional Irregularity:  No data recorded  Other Mood/Personality Symptoms:  No data recorded   Mental Status Exam Appearance and self-care  Stature:  Average   Weight:  Average weight   Clothing:  No  data recorded  Grooming:  Normal   Cosmetic use:  None   Posture/gait:  Normal   Motor activity:  Restless;Agitated   Sensorium  Attention:  Distractible   Concentration:  Scattered   Orientation:  X5   Recall/memory:  Normal   Affect and Mood  Affect:  Anxious;Depressed   Mood:  Anxious;Depressed   Relating  Eye contact:  Normal   Facial expression:  Anxious;Depressed   Attitude toward examiner:  Cooperative   Thought and Language  Speech flow: Clear and Coherent   Thought content:  Appropriate to Mood and Circumstances   Preoccupation:  Somatic   Hallucinations:  None   Organization:  No data recorded  Transport planner of Knowledge:  Good   Intelligence:  Above Average   Abstraction:  Normal   Judgement:  Fair   Art therapist:  Realistic   Insight:  Fair   Decision Making:  Normal   Social Functioning  Social Maturity:  Responsible   Social Judgement:  Normal   Stress  Stressors:  No data recorded  Coping Ability:  Normal   Skill Deficits:  None   Supports:  Family;Church     Religion: Religion/Spirituality Are You A Religious Person?: Yes  Leisure/Recreation:    Exercise/Diet: Exercise/Diet Do You Exercise?: No Have You Gained or Lost A Significant Amount of Weight in the Past Six Months?: No Do You Follow a Special Diet?: No Do You Have Any Trouble Sleeping?: Yes   CCA Employment/Education Employment/Work Situation: Employment / Work Situation Employment situation: On disability How long has patient been on disability: 3 years Has patient ever been in the [employer]?: No  Education: Education Did Teacher, adult education From [employer]?: Yes Did You Have Any Difficulty At [employer]?: No   CCA Family/Childhood History Family and Relationship History: Family history Marital status: Married  Childhood History:  Childhood History By whom was/is the patient raised?: Both parents Additional childhood history  information: Father committed suicide 20 years ago. Mother had depression afterwards. Description of patient's relationship with caregiver when they were a child: stable Did patient suffer any verbal/emotional/physical/sexual abuse as a child?: No Did patient suffer from severe childhood neglect?: No Has patient ever been sexually abused/assaulted/raped as an adolescent or adult?: No Was the patient ever a victim of a crime or a disaster?: No Witnessed domestic violence?: No Has patient been affected by domestic violence as an adult?: No  Child/Adolescent Assessment:     CCA Substance Use Alcohol/Drug Use: Alcohol / Drug Use Prescriptions: ativan, fetzima History of alcohol / drug use?: No history of alcohol / drug abuse      ASAM's:  Six Dimensions of Multidimensional Assessment  Dimension 1:  Acute Intoxication and/or Withdrawal Potential:   Dimension 1:  Description of individual's past and current experiences of substance use and withdrawal: pt denies  Dimension 2:  Biomedical Conditions and Complications:      Dimension 3:  Emotional, Behavioral, or Cognitive Conditions and Complications:     Dimension 4:  Readiness  to Change:     Dimension 5:  Relapse, Continued use, or Continued Problem Potential:     Dimension 6:  Recovery/Living Environment:     ASAM Severity Score: ASAM's Severity Rating Score: 0  ASAM Recommended Level of Treatment:     Substance use Disorder (SUD)    Recommendations for Services/Supports/Treatments:    DSM5 Diagnoses: Patient Active Problem List   Diagnosis Date Noted  . Generalized anxiety disorder   . Routine cervical smear 07/17/2019  . Superficial fungus infection of skin 05/22/2018  . Screening for colorectal cancer 05/22/2018  . Encounter for well woman exam with routine gynecological exam 05/22/2018  . Encounter for gynecological examination with Papanicolaou smear of cervix 03/30/2017  . Hematuria 03/30/2017  . Urge incontinence  03/30/2017  . Special screening for malignant neoplasms, colon   . Constipation - functional 01/20/2015  . Wrist pain, right 03/02/2014  . 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 Complex regional pain syndrome type 1 affecting right forearm 03/02/2014  . 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 Tension headache 10/20/2013  . Anxiety 10/20/2013  . Hot flashes 10/15/2013  . Depression, recurrent (Winter) 10/15/2013  . Tinnitus 10/15/2013    Referrals to Alternative Service(s): Referred to Alternative Service(s):   Place:   Date:   Time:    Referred to Alternative Service(s):   Place:   Date:   Time:    Referred to Alternative Service(s):   Place:   Date:   Time:    Referred to Alternative Service(s):   Place:   Date:   Time:     Rachel Bo Crit Obremski, LCSW

## 2020-05-12 NOTE — Discharge Instructions (Addendum)
Patient was recommended to call insurance provider to have them provide a list of psychiatrist in her area. Patient was agreeable to recommendation.

## 2020-05-12 NOTE — ED Notes (Signed)
AVS reviewed with patient and all questions answered. Pt stated husband had belongings in lobby & none were placed in locker. Pt A&O in no acute distress at time of discharge.

## 2020-05-12 NOTE — ED Triage Notes (Signed)
Patient states she is here for severe depression. Patient states she is on medication which was tapered up along with other medications added. Patient states her medication is not working and she feels she is worst and has cried everyday with bad anxiety. Patient states she has severe ringings and roaring in her ears and that is why she is on depression medication because she could not sleep, concentrate and came out of work 3 years ago. Patient was recommended by Kassie Mends PA at [facility] along with Dr. Rory Percy that  patient come in her for an evaluation today. Patient denies SI/HI today at this time; but in last month and half she was at that time.

## 2020-05-13 NOTE — ED Provider Notes (Addendum)
Behavioral Health Urgent Care Medical Screening Exam  Patient Name: Melanie Jimenez MRN: [id number] Date of Evaluation: 05/13/20 Chief Complaint: Chief Complaint/Presenting Problem: [name] is a 59yo female presenting as a walk in to [facility] for evaluation of depression symptoms. Pt is currently a patient of Dr. Casimiro Needle and has had several medication changes, the most recent change on 10/25.  Pt was switched from Effexor to [facility], and is also currently taking 4-5mg  of Ativan a day. Pt states "one day it was so bad I took 5 ativan at once just to calm myself down". Pt reports severe depression and anxiety symptoms along with insomnia. Pt reports that she is in chronic pain (right wrist) and has tinnitus in both ears that cause her to have a constant ringing in her ears. Pt has a history of multiple antidepressant medication trials, most recent switch from Effexor to [facility]. Pt feels that current medication not working at all to manage depression or anxiety symptoms. Pt also taking trazodone to help sleep. Pt currently on disability (3 yrs ago).Pt denies any current SI, HI, or AVH.  Pt reports that she wants medication stabilized and would like other outpatient psychiatric resources.  Pt denies any acute external stressors--just wants to be on the right medication to manage symptoms Diagnosis:  Final diagnoses:  Moderate episode of recurrent major depressive disorder (HCC)    History of Present illness: Melanie Jimenez is a 59 y.o. female with a past medical/psychiatric history significant for bilateral tinnitus and depression. Patient was diagnosed with bilateral tinniitus in 2014 and is constantly tormented by loud ringing/roaring sounds. Patient reports her tinnitus makes her head feel like it is roaring with water. Since being diagnosed with tinnitus patient has been dealing with depression as a result. To combat her depression, the patient's psychiatrist over at [place] placed her on  Effexor, eventually titrating up to 225 mg for the management of her depression. Patient found no relief from her depression so she was eventually weaned off of Effexor and placed on Fetzima. Patient reports intractable crying and increased anxiety since being placed on Fetzima. In addition to [facility], patient is also taking trazodone and ativan.  Patient denies current suicide ideation and homicide ideation. She further denies auditory and visual hallucinations. Patient states that her relationship with her current psychiatrist is poor. Patient states that her psychiatrist is often hard to reach out to and has cancelled scheduled appointments out of the blue. Patient states that she is a resident of [facility] and has insurance. Patient's main concern was having current medications stabilized. Patient was informed by the writer that she should reach out to her current psychiatrist for medication modification since they are more familiar with the patient's history. Since patient endorsed poor relationship with current provider, patient was recommended to contact her insurance provider and ask for a list of psychiatrists covered by her insurance. Patient was agreeable to these recommendations.  Psychiatric Specialty Exam  Presentation  General Appearance:Appropriate for Environment;Well Groomed  Eye Contact:Good  Speech:Clear and Coherent;Normal Rate  Speech Volume:Normal  Handedness:Right   Mood and Affect  Mood:Anxious;Depressed;Dysphoric;Labile  Affect:Congruent;Depressed;Tearful   Thought Process  Thought Processes:Coherent;Goal Directed  Descriptions of Associations:Intact  Orientation:Full (Time, Place and Person)  Thought Content:Logical;WDL  Hallucinations:None  Ideas of Reference:None  Suicidal Thoughts:No  Homicidal Thoughts:No   Sensorium  Memory:Immediate Good;Recent Good;Remote Good  Judgment:Good  Insight:Good   Executive Functions   Concentration:Good  Attention Span:Good  [employer]  Psychomotor Activity  Psychomotor Activity:Restlessness   Assets  Assets:Communication Skills;Desire for Improvement;Financial Resources/Insurance;Housing;Social Support   Sleep  Sleep:Fair  Number of hours: No data recorded  Physical Exam: Physical Exam Constitutional:      Appearance: Normal appearance.  HENT:     Head: Normocephalic and atraumatic.     Nose: Nose normal.  Eyes:     Extraocular Movements: Extraocular movements intact.     Pupils: Pupils are equal, round, and reactive to light.  Cardiovascular:     Rate and Rhythm: Tachycardia present.  Pulmonary:     Effort: Pulmonary effort is normal.     Breath sounds: Normal breath sounds.  Musculoskeletal:        General: Normal range of motion.     Cervical back: Normal range of motion and neck supple.  Skin:    General: Skin is warm and dry.  Neurological:     General: No focal deficit present.     Mental Status: She is alert and oriented to person, place, and time.  Psychiatric:        Attention and Perception: Attention and perception normal. She does not perceive auditory or visual hallucinations.        Mood and Affect: Mood is anxious and depressed. Affect is labile and tearful.        Speech: Speech normal.        Behavior: Behavior is agitated. Behavior is cooperative.        Thought Content: Thought content normal. Thought content does not include homicidal or suicidal ideation.        Cognition and Memory: Cognition and memory normal.        Judgment: Judgment normal.    Review of Systems  Constitutional: Negative.   HENT: Positive for tinnitus.   Eyes: Negative.   Respiratory: Negative.   Cardiovascular: Negative.   Gastrointestinal: Negative.   Musculoskeletal: Negative.   Skin: Negative.   Endo/Heme/Allergies: Negative.   Psychiatric/Behavioral: Positive for depression. Negative for  hallucinations and suicidal ideas. The patient is nervous/anxious.    Blood pressure 126/88, pulse (!) 101, temperature (!) 97.1 F (36.2 C), temperature source Oral, resp. rate 18, height 4\' 11"  (1.499 m), weight 165 lb (74.8 kg), SpO2 98 %. Body mass index is 33.33 kg/m.  Musculoskeletal: Strength & Muscle Tone: within normal limits Gait & Station: normal Patient leans: N/A   [place] MSE Discharge Disposition for Follow up and Recommendations: Based on my evaluation the patient does not appear to have an emergency medical condition and can be discharged with resources and follow up care in outpatient services for managment of her psychiatric medication. Patient was recommended to contact insurance provider for a list of psychiatrist covered under her insurance. Patient was able to contract for safety before discharge.   Malachy Mood, PA 05/13/2020, 3:53 AM

## 2020-05-14 DIAGNOSIS — F331 Major depressive disorder, recurrent, moderate: Secondary | ICD-10-CM | POA: Diagnosis not present

## 2020-05-31 DIAGNOSIS — F411 Generalized anxiety disorder: Secondary | ICD-10-CM | POA: Diagnosis not present

## 2020-05-31 DIAGNOSIS — F331 Major depressive disorder, recurrent, moderate: Secondary | ICD-10-CM | POA: Diagnosis not present

## 2020-06-01 DIAGNOSIS — F331 Major depressive disorder, recurrent, moderate: Secondary | ICD-10-CM | POA: Diagnosis not present

## 2020-06-01 DIAGNOSIS — K219 Gastro-esophageal reflux disease without esophagitis: Secondary | ICD-10-CM | POA: Diagnosis not present

## 2020-06-01 DIAGNOSIS — E7800 Pure hypercholesterolemia, unspecified: Secondary | ICD-10-CM | POA: Diagnosis not present

## 2020-06-08 DIAGNOSIS — M654 Radial styloid tenosynovitis [de Quervain]: Secondary | ICD-10-CM | POA: Diagnosis not present

## 2020-06-15 DIAGNOSIS — F331 Major depressive disorder, recurrent, moderate: Secondary | ICD-10-CM | POA: Diagnosis not present

## 2020-06-30 DIAGNOSIS — F064 Anxiety disorder due to known physiological condition: Secondary | ICD-10-CM | POA: Diagnosis not present

## 2020-07-05 DIAGNOSIS — F411 Generalized anxiety disorder: Secondary | ICD-10-CM | POA: Diagnosis not present

## 2020-07-05 DIAGNOSIS — F331 Major depressive disorder, recurrent, moderate: Secondary | ICD-10-CM | POA: Diagnosis not present

## 2020-07-26 DIAGNOSIS — F064 Anxiety disorder due to known physiological condition: Secondary | ICD-10-CM | POA: Diagnosis not present

## 2020-07-26 DIAGNOSIS — F331 Major depressive disorder, recurrent, moderate: Secondary | ICD-10-CM | POA: Diagnosis not present

## 2020-07-28 DIAGNOSIS — Z20828 Contact with and (suspected) exposure to other viral communicable diseases: Secondary | ICD-10-CM | POA: Diagnosis not present

## 2020-07-28 DIAGNOSIS — R059 Cough, unspecified: Secondary | ICD-10-CM | POA: Diagnosis not present

## 2020-08-11 DIAGNOSIS — E7800 Pure hypercholesterolemia, unspecified: Secondary | ICD-10-CM | POA: Diagnosis not present

## 2020-08-11 DIAGNOSIS — K219 Gastro-esophageal reflux disease without esophagitis: Secondary | ICD-10-CM | POA: Diagnosis not present

## 2020-08-11 DIAGNOSIS — R739 Hyperglycemia, unspecified: Secondary | ICD-10-CM | POA: Diagnosis not present

## 2020-08-11 DIAGNOSIS — E7801 Familial hypercholesterolemia: Secondary | ICD-10-CM | POA: Diagnosis not present

## 2020-08-17 DIAGNOSIS — R739 Hyperglycemia, unspecified: Secondary | ICD-10-CM | POA: Diagnosis not present

## 2020-08-17 DIAGNOSIS — R5382 Chronic fatigue, unspecified: Secondary | ICD-10-CM | POA: Diagnosis not present

## 2020-08-17 DIAGNOSIS — M792 Neuralgia and neuritis, unspecified: Secondary | ICD-10-CM | POA: Diagnosis not present

## 2020-08-17 DIAGNOSIS — R519 Headache, unspecified: Secondary | ICD-10-CM | POA: Diagnosis not present

## 2020-08-17 DIAGNOSIS — Z87442 Personal history of urinary calculi: Secondary | ICD-10-CM | POA: Diagnosis not present

## 2020-08-17 DIAGNOSIS — Z6828 Body mass index (BMI) 28.0-28.9, adult: Secondary | ICD-10-CM | POA: Diagnosis not present

## 2020-08-17 DIAGNOSIS — F331 Major depressive disorder, recurrent, moderate: Secondary | ICD-10-CM | POA: Diagnosis not present

## 2020-08-17 DIAGNOSIS — H9313 Tinnitus, bilateral: Secondary | ICD-10-CM | POA: Diagnosis not present

## 2020-08-30 DIAGNOSIS — F331 Major depressive disorder, recurrent, moderate: Secondary | ICD-10-CM | POA: Diagnosis not present

## 2020-08-31 DIAGNOSIS — H903 Sensorineural hearing loss, bilateral: Secondary | ICD-10-CM | POA: Diagnosis not present

## 2020-09-09 DIAGNOSIS — H903 Sensorineural hearing loss, bilateral: Secondary | ICD-10-CM | POA: Diagnosis not present

## 2020-09-13 DIAGNOSIS — F064 Anxiety disorder due to known physiological condition: Secondary | ICD-10-CM | POA: Diagnosis not present

## 2020-10-04 DIAGNOSIS — F411 Generalized anxiety disorder: Secondary | ICD-10-CM | POA: Diagnosis not present

## 2020-11-04 DIAGNOSIS — M654 Radial styloid tenosynovitis [de Quervain]: Secondary | ICD-10-CM | POA: Diagnosis not present

## 2020-11-19 DIAGNOSIS — L84 Corns and callosities: Secondary | ICD-10-CM | POA: Diagnosis not present

## 2020-11-19 DIAGNOSIS — F17210 Nicotine dependence, cigarettes, uncomplicated: Secondary | ICD-10-CM | POA: Diagnosis not present

## 2020-11-19 DIAGNOSIS — Z6828 Body mass index (BMI) 28.0-28.9, adult: Secondary | ICD-10-CM | POA: Diagnosis not present

## 2020-11-19 DIAGNOSIS — M79673 Pain in unspecified foot: Secondary | ICD-10-CM | POA: Diagnosis not present

## 2020-11-22 DIAGNOSIS — F331 Major depressive disorder, recurrent, moderate: Secondary | ICD-10-CM | POA: Diagnosis not present

## 2020-12-01 DIAGNOSIS — M722 Plantar fascial fibromatosis: Secondary | ICD-10-CM | POA: Diagnosis not present

## 2020-12-01 DIAGNOSIS — M778 Other enthesopathies, not elsewhere classified: Secondary | ICD-10-CM | POA: Diagnosis not present

## 2020-12-01 DIAGNOSIS — M79672 Pain in left foot: Secondary | ICD-10-CM | POA: Diagnosis not present

## 2020-12-21 DIAGNOSIS — M778 Other enthesopathies, not elsewhere classified: Secondary | ICD-10-CM | POA: Diagnosis not present

## 2020-12-21 DIAGNOSIS — M79672 Pain in left foot: Secondary | ICD-10-CM | POA: Diagnosis not present

## 2020-12-27 DIAGNOSIS — F331 Major depressive disorder, recurrent, moderate: Secondary | ICD-10-CM | POA: Diagnosis not present

## 2021-01-06 DIAGNOSIS — L03114 Cellulitis of left upper limb: Secondary | ICD-10-CM | POA: Diagnosis not present

## 2021-01-06 DIAGNOSIS — Z6828 Body mass index (BMI) 28.0-28.9, adult: Secondary | ICD-10-CM | POA: Diagnosis not present

## 2021-01-06 DIAGNOSIS — T63441A Toxic effect of venom of bees, accidental (unintentional), initial encounter: Secondary | ICD-10-CM | POA: Diagnosis not present

## 2021-01-18 DIAGNOSIS — M778 Other enthesopathies, not elsewhere classified: Secondary | ICD-10-CM | POA: Diagnosis not present

## 2021-01-18 DIAGNOSIS — M79672 Pain in left foot: Secondary | ICD-10-CM | POA: Diagnosis not present

## 2021-01-24 DIAGNOSIS — F411 Generalized anxiety disorder: Secondary | ICD-10-CM | POA: Diagnosis not present

## 2021-01-24 DIAGNOSIS — F331 Major depressive disorder, recurrent, moderate: Secondary | ICD-10-CM | POA: Diagnosis not present

## 2021-01-27 DIAGNOSIS — Z6827 Body mass index (BMI) 27.0-27.9, adult: Secondary | ICD-10-CM | POA: Diagnosis not present

## 2021-01-27 DIAGNOSIS — R197 Diarrhea, unspecified: Secondary | ICD-10-CM | POA: Diagnosis not present

## 2021-01-27 DIAGNOSIS — R5383 Other fatigue: Secondary | ICD-10-CM | POA: Diagnosis not present

## 2021-01-27 DIAGNOSIS — F17210 Nicotine dependence, cigarettes, uncomplicated: Secondary | ICD-10-CM | POA: Diagnosis not present

## 2021-01-28 DIAGNOSIS — R197 Diarrhea, unspecified: Secondary | ICD-10-CM | POA: Diagnosis not present

## 2021-02-09 DIAGNOSIS — Z0001 Encounter for general adult medical examination with abnormal findings: Secondary | ICD-10-CM | POA: Diagnosis not present

## 2021-02-09 DIAGNOSIS — Z23 Encounter for immunization: Secondary | ICD-10-CM | POA: Diagnosis not present

## 2021-02-09 DIAGNOSIS — F17210 Nicotine dependence, cigarettes, uncomplicated: Secondary | ICD-10-CM | POA: Diagnosis not present

## 2021-02-09 DIAGNOSIS — R739 Hyperglycemia, unspecified: Secondary | ICD-10-CM | POA: Diagnosis not present

## 2021-02-09 DIAGNOSIS — H9313 Tinnitus, bilateral: Secondary | ICD-10-CM | POA: Diagnosis not present

## 2021-02-09 DIAGNOSIS — R519 Headache, unspecified: Secondary | ICD-10-CM | POA: Diagnosis not present

## 2021-02-09 DIAGNOSIS — M792 Neuralgia and neuritis, unspecified: Secondary | ICD-10-CM | POA: Diagnosis not present

## 2021-02-09 DIAGNOSIS — F331 Major depressive disorder, recurrent, moderate: Secondary | ICD-10-CM | POA: Diagnosis not present

## 2021-02-10 ENCOUNTER — Other Ambulatory Visit (HOSPITAL_COMMUNITY): Payer: Self-pay | Admitting: Adult Health

## 2021-02-10 DIAGNOSIS — Z1231 Encounter for screening mammogram for malignant neoplasm of breast: Secondary | ICD-10-CM

## 2021-02-22 DIAGNOSIS — M778 Other enthesopathies, not elsewhere classified: Secondary | ICD-10-CM | POA: Diagnosis not present

## 2021-02-22 DIAGNOSIS — M79672 Pain in left foot: Secondary | ICD-10-CM | POA: Diagnosis not present

## 2021-02-23 DIAGNOSIS — M2242 Chondromalacia patellae, left knee: Secondary | ICD-10-CM | POA: Diagnosis not present

## 2021-02-23 DIAGNOSIS — M25561 Pain in right knee: Secondary | ICD-10-CM | POA: Diagnosis not present

## 2021-02-23 DIAGNOSIS — M25562 Pain in left knee: Secondary | ICD-10-CM | POA: Diagnosis not present

## 2021-02-24 ENCOUNTER — Other Ambulatory Visit: Payer: Self-pay | Admitting: Sports Medicine

## 2021-02-24 DIAGNOSIS — Z122 Encounter for screening for malignant neoplasm of respiratory organs: Secondary | ICD-10-CM | POA: Diagnosis not present

## 2021-02-24 DIAGNOSIS — M25562 Pain in left knee: Secondary | ICD-10-CM

## 2021-02-24 DIAGNOSIS — F17210 Nicotine dependence, cigarettes, uncomplicated: Secondary | ICD-10-CM | POA: Diagnosis not present

## 2021-03-18 DIAGNOSIS — F331 Major depressive disorder, recurrent, moderate: Secondary | ICD-10-CM | POA: Diagnosis not present

## 2021-03-18 DIAGNOSIS — F411 Generalized anxiety disorder: Secondary | ICD-10-CM | POA: Diagnosis not present

## 2021-03-22 DIAGNOSIS — M25562 Pain in left knee: Secondary | ICD-10-CM | POA: Diagnosis not present

## 2021-03-24 ENCOUNTER — Ambulatory Visit (HOSPITAL_COMMUNITY)
Admission: RE | Admit: 2021-03-24 | Discharge: 2021-03-24 | Disposition: A | Discharge status: Home / Self Care | Payer: PPO | Source: Ambulatory Visit | Attending: Adult Health | Admitting: Adult Health

## 2021-03-24 ENCOUNTER — Other Ambulatory Visit: Payer: Self-pay

## 2021-03-24 DIAGNOSIS — Z1231 Encounter for screening mammogram for malignant neoplasm of breast: Secondary | ICD-10-CM | POA: Insufficient documentation

## 2021-03-28 DIAGNOSIS — M2242 Chondromalacia patellae, left knee: Secondary | ICD-10-CM | POA: Diagnosis not present

## 2021-03-28 DIAGNOSIS — M25562 Pain in left knee: Secondary | ICD-10-CM | POA: Diagnosis not present

## 2021-04-06 ENCOUNTER — Telehealth: Payer: Self-pay | Admitting: *Deleted

## 2021-04-06 NOTE — Telephone Encounter (Signed)
Pt called requesting mammogram results. Informed patient results are normal and recommend repeat in 1 year. No further questions at this time.

## 2021-04-08 DIAGNOSIS — M545 Low back pain, unspecified: Secondary | ICD-10-CM | POA: Diagnosis not present

## 2021-04-08 DIAGNOSIS — Z6827 Body mass index (BMI) 27.0-27.9, adult: Secondary | ICD-10-CM | POA: Diagnosis not present

## 2021-04-08 DIAGNOSIS — F17210 Nicotine dependence, cigarettes, uncomplicated: Secondary | ICD-10-CM | POA: Diagnosis not present

## 2021-04-08 DIAGNOSIS — R030 Elevated blood-pressure reading, without diagnosis of hypertension: Secondary | ICD-10-CM | POA: Diagnosis not present

## 2021-04-11 DIAGNOSIS — M546 Pain in thoracic spine: Secondary | ICD-10-CM | POA: Diagnosis not present

## 2021-04-11 DIAGNOSIS — M9903 Segmental and somatic dysfunction of lumbar region: Secondary | ICD-10-CM | POA: Diagnosis not present

## 2021-04-11 DIAGNOSIS — M9902 Segmental and somatic dysfunction of thoracic region: Secondary | ICD-10-CM | POA: Diagnosis not present

## 2021-04-11 DIAGNOSIS — M47816 Spondylosis without myelopathy or radiculopathy, lumbar region: Secondary | ICD-10-CM | POA: Diagnosis not present

## 2021-04-11 DIAGNOSIS — M47812 Spondylosis without myelopathy or radiculopathy, cervical region: Secondary | ICD-10-CM | POA: Diagnosis not present

## 2021-04-11 DIAGNOSIS — M9901 Segmental and somatic dysfunction of cervical region: Secondary | ICD-10-CM | POA: Diagnosis not present

## 2021-04-13 DIAGNOSIS — M47816 Spondylosis without myelopathy or radiculopathy, lumbar region: Secondary | ICD-10-CM | POA: Diagnosis not present

## 2021-04-13 DIAGNOSIS — M9901 Segmental and somatic dysfunction of cervical region: Secondary | ICD-10-CM | POA: Diagnosis not present

## 2021-04-13 DIAGNOSIS — M9902 Segmental and somatic dysfunction of thoracic region: Secondary | ICD-10-CM | POA: Diagnosis not present

## 2021-04-13 DIAGNOSIS — M546 Pain in thoracic spine: Secondary | ICD-10-CM | POA: Diagnosis not present

## 2021-04-13 DIAGNOSIS — M9903 Segmental and somatic dysfunction of lumbar region: Secondary | ICD-10-CM | POA: Diagnosis not present

## 2021-04-13 DIAGNOSIS — M47812 Spondylosis without myelopathy or radiculopathy, cervical region: Secondary | ICD-10-CM | POA: Diagnosis not present

## 2021-04-18 DIAGNOSIS — M9903 Segmental and somatic dysfunction of lumbar region: Secondary | ICD-10-CM | POA: Diagnosis not present

## 2021-04-18 DIAGNOSIS — M47812 Spondylosis without myelopathy or radiculopathy, cervical region: Secondary | ICD-10-CM | POA: Diagnosis not present

## 2021-04-18 DIAGNOSIS — M9901 Segmental and somatic dysfunction of cervical region: Secondary | ICD-10-CM | POA: Diagnosis not present

## 2021-04-18 DIAGNOSIS — M546 Pain in thoracic spine: Secondary | ICD-10-CM | POA: Diagnosis not present

## 2021-04-18 DIAGNOSIS — M9902 Segmental and somatic dysfunction of thoracic region: Secondary | ICD-10-CM | POA: Diagnosis not present

## 2021-04-18 DIAGNOSIS — M47816 Spondylosis without myelopathy or radiculopathy, lumbar region: Secondary | ICD-10-CM | POA: Diagnosis not present

## 2021-04-19 DIAGNOSIS — Z23 Encounter for immunization: Secondary | ICD-10-CM | POA: Diagnosis not present

## 2021-04-20 DIAGNOSIS — M47816 Spondylosis without myelopathy or radiculopathy, lumbar region: Secondary | ICD-10-CM | POA: Diagnosis not present

## 2021-04-20 DIAGNOSIS — M47812 Spondylosis without myelopathy or radiculopathy, cervical region: Secondary | ICD-10-CM | POA: Diagnosis not present

## 2021-04-20 DIAGNOSIS — M9903 Segmental and somatic dysfunction of lumbar region: Secondary | ICD-10-CM | POA: Diagnosis not present

## 2021-04-20 DIAGNOSIS — M9902 Segmental and somatic dysfunction of thoracic region: Secondary | ICD-10-CM | POA: Diagnosis not present

## 2021-04-20 DIAGNOSIS — M546 Pain in thoracic spine: Secondary | ICD-10-CM | POA: Diagnosis not present

## 2021-04-20 DIAGNOSIS — M9901 Segmental and somatic dysfunction of cervical region: Secondary | ICD-10-CM | POA: Diagnosis not present

## 2021-04-26 DIAGNOSIS — M47816 Spondylosis without myelopathy or radiculopathy, lumbar region: Secondary | ICD-10-CM | POA: Diagnosis not present

## 2021-04-26 DIAGNOSIS — M9903 Segmental and somatic dysfunction of lumbar region: Secondary | ICD-10-CM | POA: Diagnosis not present

## 2021-04-26 DIAGNOSIS — M546 Pain in thoracic spine: Secondary | ICD-10-CM | POA: Diagnosis not present

## 2021-04-26 DIAGNOSIS — M9901 Segmental and somatic dysfunction of cervical region: Secondary | ICD-10-CM | POA: Diagnosis not present

## 2021-04-26 DIAGNOSIS — M9902 Segmental and somatic dysfunction of thoracic region: Secondary | ICD-10-CM | POA: Diagnosis not present

## 2021-04-26 DIAGNOSIS — M47812 Spondylosis without myelopathy or radiculopathy, cervical region: Secondary | ICD-10-CM | POA: Diagnosis not present

## 2021-04-27 DIAGNOSIS — F331 Major depressive disorder, recurrent, moderate: Secondary | ICD-10-CM | POA: Diagnosis not present

## 2021-04-28 DIAGNOSIS — M47812 Spondylosis without myelopathy or radiculopathy, cervical region: Secondary | ICD-10-CM | POA: Diagnosis not present

## 2021-04-28 DIAGNOSIS — M546 Pain in thoracic spine: Secondary | ICD-10-CM | POA: Diagnosis not present

## 2021-04-28 DIAGNOSIS — M9903 Segmental and somatic dysfunction of lumbar region: Secondary | ICD-10-CM | POA: Diagnosis not present

## 2021-04-28 DIAGNOSIS — M9901 Segmental and somatic dysfunction of cervical region: Secondary | ICD-10-CM | POA: Diagnosis not present

## 2021-04-28 DIAGNOSIS — M47816 Spondylosis without myelopathy or radiculopathy, lumbar region: Secondary | ICD-10-CM | POA: Diagnosis not present

## 2021-04-28 DIAGNOSIS — M9902 Segmental and somatic dysfunction of thoracic region: Secondary | ICD-10-CM | POA: Diagnosis not present

## 2021-05-09 ENCOUNTER — Encounter (HOSPITAL_COMMUNITY): Payer: Self-pay

## 2021-05-09 ENCOUNTER — Emergency Department (HOSPITAL_COMMUNITY): Payer: PPO

## 2021-05-09 ENCOUNTER — Other Ambulatory Visit: Payer: Self-pay

## 2021-05-09 ENCOUNTER — Emergency Department (HOSPITAL_COMMUNITY)
Admission: EM | Admit: 2021-05-09 | Discharge: 2021-05-09 | Disposition: A | Discharge status: Home / Self Care | Payer: PPO | Attending: Emergency Medicine | Admitting: Emergency Medicine

## 2021-05-09 DIAGNOSIS — F17210 Nicotine dependence, cigarettes, uncomplicated: Secondary | ICD-10-CM | POA: Diagnosis not present

## 2021-05-09 DIAGNOSIS — R091 Pleurisy: Secondary | ICD-10-CM | POA: Insufficient documentation

## 2021-05-09 DIAGNOSIS — R079 Chest pain, unspecified: Secondary | ICD-10-CM | POA: Diagnosis not present

## 2021-05-09 DIAGNOSIS — R0789 Other chest pain: Secondary | ICD-10-CM | POA: Diagnosis present

## 2021-05-09 LAB — BASIC METABOLIC PANEL
Anion gap: 8 (ref 5–15)
BUN: 22 mg/dL — ABNORMAL HIGH (ref 6–20)
CO2: 24 mmol/L (ref 22–32)
Calcium: 9.4 mg/dL (ref 8.9–10.3)
Chloride: 104 mmol/L (ref 98–111)
Creatinine, Ser: 0.7 mg/dL (ref 0.44–1.00)
GFR, Estimated: >60 mL/min (ref >60)
Glucose, Bld: 152 mg/dL — ABNORMAL HIGH (ref 70–99)
Potassium: 3.3 mmol/L — ABNORMAL LOW (ref 3.5–5.1)
Sodium: 136 mmol/L (ref 135–145)

## 2021-05-09 LAB — CBC
HCT: 41.2 % (ref 36.0–46.0)
Hemoglobin: 14.4 g/dL (ref 12.0–15.0)
MCH: 33.9 pg (ref 26.0–34.0)
MCHC: 35 g/dL (ref 30.0–36.0)
MCV: 96.9 fL (ref 80.0–100.0)
Platelets: 256 10*3/uL (ref 150–400)
RBC: 4.25 MIL/uL (ref 3.87–5.11)
RDW: 11.9 % (ref 11.5–15.5)
WBC: 12.5 10*3/uL — ABNORMAL HIGH (ref 4.0–10.5)
nRBC: 0 % (ref 0.0–0.2)

## 2021-05-09 LAB — TROPONIN I (HIGH SENSITIVITY)
Troponin I (High Sensitivity): 7 ng/L (ref <18)
Troponin I (High Sensitivity): 7 ng/L (ref <18)

## 2021-05-09 LAB — D-DIMER, QUANTITATIVE: D-Dimer, Quant: 1.34 ug/mL-FEU — ABNORMAL HIGH (ref 0.00–0.50)

## 2021-05-09 LAB — POC URINE PREG, ED: Preg Test, Ur: NEGATIVE

## 2021-05-09 MED ORDER — NAPROXEN 500 MG PO TABS: 500.0000 mg | ORAL_TABLET | Freq: Two times a day (BID) | ORAL | 0 refills | Status: DC

## 2021-05-09 MED ORDER — IOHEXOL 350 MG/ML SOLN
75.0000 mL | Freq: Once | INTRAVENOUS | Status: AC | PRN
  Administered 2021-05-09: 75 mL via INTRAVENOUS

## 2021-05-09 NOTE — ED Triage Notes (Signed)
Patient complaining of epigastric pain for the past three days. States that pain is radiating to back and wakes her up during the night. States that she has been having HTN, elevated pulse for the past month. Reports diaphoresis.

## 2021-05-09 NOTE — ED Notes (Signed)
Pt transported to CT ?

## 2021-05-09 NOTE — ED Provider Notes (Signed)
[facility] EMERGENCY DEPARTMENT Provider Note   CSN: [id number] Arrival date & time: 05/09/21  1424     History Chief Complaint  Patient presents with   Chest Pain    Melanie Jimenez is a 60 y.o. female.   Chest Pain  This patient is a 60 year old female no prior history of coronary disease in fact she does not have a history of hypertension diabetes or tobacco use, she does have a history of hyperlipidemia for which she is medicated.  She reports that over the last week she has had some increasing high blood pressures in fact she was seen at her doctor's office and noted to be hypertensive, they never followed up with her on those numbers and over the last couple of days she has had some intermittent episodes of chest discomfort which seems to wake her up at night.  Seems to be in the middle of her chest radiating to her back and today the pain has been more constant going underneath her right arm towards her back and as well as the left arm towards the back worse with deep breathing and position, she does not have a history of exertional symptoms whatsoever.  Because of a rather disabling problem with tinnitus the patient does not exercise that much because it seems to make it worse.  She has not been taking any significant over-the-counter medications, she has been following her blood pressure and her heart rate the last couple of days and she is persistently tachycardic and hypertensive measuring blood pressures as high as 170  Past Medical History:  Diagnosis Date   Anxiety    Chronic kidney disease    kidney stones,blood in urine   Constipation - functional 01/20/2015   Degeneration of cartilage    left knee   Depression    GERD (gastroesophageal reflux disease)    Headache    Hot flashes 10/15/2013   Tinnitus 10/15/2013   Tinnitus of both ears     Patient Active Problem List   Diagnosis Date Noted   Generalized anxiety disorder    Routine cervical smear 07/17/2019    Superficial fungus infection of skin 05/22/2018   Screening for colorectal cancer 05/22/2018   Encounter for well woman exam with routine gynecological exam 05/22/2018   Encounter for gynecological examination with Papanicolaou smear of cervix 03/30/2017   Hematuria 03/30/2017   Urge incontinence 03/30/2017   Special screening for malignant neoplasms, colon    Constipation - functional 01/20/2015   Wrist pain, right 03/02/2014   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   Complex regional pain syndrome type 1 affecting right forearm 03/02/2014   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   Tension headache 10/20/2013   Anxiety 10/20/2013   Hot flashes 10/15/2013   Depression, recurrent ([place]) 10/15/2013   Tinnitus 10/15/2013    Past Surgical History:  Procedure Laterality Date   APPENDECTOMY     CESAREAN SECTION     COLONOSCOPY N/A 04/07/2016   Procedure: COLONOSCOPY;  Surgeon: Danie Binder, MD;  [facility];  Service: Endoscopy;  Laterality: N/A;  9:30 Am   COLONOSCOPY N/A 06/09/2016   Procedure: COLONOSCOPY;  Surgeon: Danie Binder, MD;  [facility];  Service: Endoscopy;  Laterality: N/A;  9:30 am   LAPAROSCOPIC APPENDECTOMY N/A 11/25/2014   Procedure: LAPAROSCOPIC APPENDECTOMY AND LAPAROSCOPIC EXCISION OF ABDOMINAL MASS;  Surgeon: Donnie Mesa, MD;  Location: [place];  Service: General;  Laterality: N/A;   lasix surgery  POLYPECTOMY  04/07/2016   Procedure: POLYPECTOMY;  Surgeon: Danie Binder, MD;  [facility];  Service: Endoscopy;;  colon   TONSILECTOMY, ADENOIDECTOMY, BILATERAL MYRINGOTOMY AND TUBES     TONSILLECTOMY       OB History     Gravida  1   Para  1   Term  1   Preterm      AB      Living  1      SAB      IAB      Ectopic      Multiple      Live Births  1           Family History  Problem Relation Age of Onset   Diabetes Mother    Hyperlipidemia Mother    Diabetes Father    Peripheral vascular disease  Father    Diabetes Maternal Grandmother    Diabetes Maternal Grandfather    Colon cancer Neg Hx     Social History   Tobacco Use   Smoking status: Every Day    Packs/day: 1.00    Years: 30.00    Pack years: 30.00    Types: Cigarettes    Start date: 01/22/1977   Smokeless tobacco: Never  Vaping Use   Vaping Use: Never used  Substance Use Topics   Alcohol use: No    Alcohol/week: 0.0 standard drinks   Drug use: No    Home Medications Prior to Admission medications   Medication Sig Start Date End Date Taking? Authorizing Provider  ASHWAGANDHA PO Take 1 capsule by mouth daily.   Yes [provider]  cholecalciferol (VITAMIN D3) 25 MCG (1000 UNIT) tablet Take 1,000 Units by mouth daily.   Yes [provider]  CRESTOR 10 MG tablet Take 10 mg by mouth at bedtime.  10/13/13  Yes [provider]  diclofenac (VOLTAREN) 75 MG EC tablet Take 1 tablet by mouth 2 (two) times daily as needed for pain. Foot pain 04/22/21  Yes [provider]  gabapentin (NEURONTIN) 100 MG capsule Take 1 capsule by mouth 3 (three) times daily as needed for pain.  Arm pain 03/23/21  Yes [provider]  lactobacillus acidophilus (BACID) TABS tablet Take 2 tablets by mouth 3 (three) times daily.   Yes [provider]  LORazepam (ATIVAN) 1 MG tablet Take 2 mg by mouth 2 (two) times daily. 03/10/21  Yes [provider]  naproxen (NAPROSYN) 500 MG tablet Take 1 tablet (500 mg total) by mouth 2 (two) times daily with a meal. 05/09/21  Yes Noemi Chapel, MD  Omega-3 Fatty Acids (FISH OIL) 1000 MG CAPS Take 1 capsule by mouth daily.   Yes [provider]  traMADol (ULTRAM) 50 MG tablet Take 50 mg by mouth every 6 (six) hours as needed for pain. 04/19/21  Yes [provider]  traZODone (DESYREL) 100 MG tablet Take 300 mg by mouth at bedtime. 06/27/19  Yes [provider]  predniSONE (DELTASONE) 20 MG tablet Take 40 mg by mouth daily.  Starting 11.1.22 x 5 days. Patient not taking: Reported on 05/09/2021 05/02/21   [provider]    Allergies    Wellbutrin [bupropion]  Review of Systems   Review of Systems  Cardiovascular:  Positive for chest pain.  All other systems reviewed and are negative.  Physical Exam Updated Vital Signs BP 123/72   Pulse 85   Temp 98.1 F (36.7 C) (Oral)   Resp 13  Ht 1.6 m (5\' 3" )   Wt 68.9 kg   SpO2 99%   BMI 26.93 kg/m   Physical Exam Vitals and nursing note reviewed.  Constitutional:      General: She is not in acute distress.    Appearance: She is well-developed.  HENT:     Head: Normocephalic and atraumatic.     Mouth/Throat:     Pharynx: No oropharyngeal exudate.  Eyes:     General: No scleral icterus.       Right eye: No discharge.        Left eye: No discharge.     Conjunctiva/sclera: Conjunctivae normal.     Pupils: Pupils are equal, round, and reactive to light.  Neck:     Thyroid: No thyromegaly.     Vascular: No JVD.  Cardiovascular:     Rate and Rhythm: Normal rate and regular rhythm.     Heart sounds: Normal heart sounds. No murmur heard.   No friction rub. No gallop.  Pulmonary:     Effort: Pulmonary effort is normal. No respiratory distress.     Breath sounds: Normal breath sounds. No wheezing or rales.  Abdominal:     General: Bowel sounds are normal. There is no distension.     Palpations: Abdomen is soft. There is no mass.     Tenderness: There is no abdominal tenderness.  Musculoskeletal:        General: No tenderness. Normal range of motion.     Cervical back: Normal range of motion and neck supple.     Right lower leg: No tenderness. No edema.     Left lower leg: No tenderness. No edema.  Lymphadenopathy:     Cervical: No cervical adenopathy.  Skin:    General: Skin is warm and dry.     Findings: No erythema or rash.  Neurological:     Mental Status: She is alert.     Coordination: Coordination normal.  Psychiatric:         Behavior: Behavior normal.    ED Results / Procedures / Treatments   Labs (all labs ordered are listed, but only abnormal results are displayed) Labs Reviewed  BASIC METABOLIC PANEL - Abnormal; Notable for the following components:      Result Value   Potassium 3.3 (*)    Glucose, Bld 152 (*)    BUN 22 (*)    All other components within normal limits  CBC - Abnormal; Notable for the following components:   WBC 12.5 (*)    All other components within normal limits  D-DIMER, QUANTITATIVE - Abnormal; Notable for the following components:   D-Dimer, Quant 1.34 (*)    All other components within normal limits  POC URINE PREG, ED  TROPONIN I (HIGH SENSITIVITY)  TROPONIN I (HIGH SENSITIVITY)    EKG EKG Interpretation  Date/Time:  Monday May 09 2021 14:42:24 EST Ventricular Rate:  108 PR Interval:  160 QRS Duration: 90 QT Interval:  334 QTC Calculation: 447 R Axis:   -13 Text Interpretation: Sinus tachycardia Otherwise normal ECG Confirmed by Noemi Chapel [phone]) on 05/09/2021 3:13:31 PM  Radiology DG Chest 2 View  Result Date: 05/09/2021 CLINICAL DATA:  Chest pain EXAM: CHEST - 2 VIEW COMPARISON:  02/24/2021 CT from [facility] [place]. FINDINGS: Midline trachea. Normal heart size and mediastinal contours. No pleural effusion or pneumothorax. Diffuse peribronchial thickening. IMPRESSION: No acute cardiopulmonary disease. Peribronchial thickening which may relate to chronic bronchitis or smoking. Electronically Signed   By: Marylyn Ishihara  Jobe Igo M.D.   On: 05/09/2021 15:01   CT Angio Chest PE W and/or Wo Contrast  Result Date: 05/09/2021 CLINICAL DATA:  Epigastric pain for 3 days. Hypertension. Diaphoresis. Rule out pulmonary embolus. EXAM: CT ANGIOGRAPHY CHEST WITH CONTRAST TECHNIQUE: Multidetector CT imaging of the chest was performed using the standard protocol during bolus administration of intravenous contrast. Multiplanar CT image reconstructions and MIPs were obtained to evaluate the  vascular anatomy. CONTRAST:  48mL OMNIPAQUE IOHEXOL 350 MG/ML SOLN COMPARISON:  None. FINDINGS: Cardiovascular: Satisfactory opacification of the pulmonary arteries to the segmental level. No evidence of pulmonary embolism. Aortic atherosclerosis. Coronary artery atherosclerotic calcifications. No pericardial effusion. Mediastinum/Nodes: No enlarged mediastinal, hilar, or axillary lymph nodes. Thyroid gland, trachea, and esophagus demonstrate no significant findings. Lungs/Pleura: Mild paraseptal emphysema. No pleural effusion, airspace consolidation, or pneumothorax. No suspicious lung nodules. Upper Abdomen: No acute abnormality within the imaged portions of the upper abdomen. Small stones are identified within the upper pole of the left kidney measuring up to 3 mm. Musculoskeletal: No chest wall abnormality. No acute or significant osseous findings. Review of the MIP images confirms the above findings. IMPRESSION: 1. No evidence for acute pulmonary embolus. 2. Coronary artery calcifications noted. 3. Nonobstructing left renal calculi. 4. Aortic Atherosclerosis (ICD10-I70.0) and Emphysema (ICD10-J43.9). Electronically Signed   By: Kerby Moors M.D.   On: 05/09/2021 17:56    Procedures Procedures   Medications Ordered in ED Medications  iohexol (OMNIPAQUE) 350 MG/ML injection 75 mL (75 mLs Intravenous Contrast Given 05/09/21 1724)    ED Course  I have reviewed the triage vital signs and the nursing notes.  Pertinent labs & imaging results that were available during my care of the patient were reviewed by me and considered in my medical decision making (see chart for details).    MDM Rules/Calculators/A&P                           This patient is a normal x-ray, her EKG shows mild sinus tachycardia, labs are otherwise pending at this time.  She does not seem to have classic cardiac sounding history in fact this seems atypical more like a pleurisy, will check a D-dimer in addition to her other  labs to make sure were not dealing with a missed pulmonary embolism.  Patient agreeable to the plan  X-rays negative, CT negative, troponins negative, patient stable for discharge, informed of her results  Final Clinical Impression(s) / ED Diagnoses Final diagnoses:  Pleurisy    Rx / DC Orders ED Discharge Orders          Ordered    naproxen (NAPROSYN) 500 MG tablet  2 times daily with meals        05/09/21 1839             Noemi Chapel, MD 05/09/21 1840

## 2021-05-09 NOTE — Discharge Instructions (Signed)
Tests are normal, no signs of heart attack, no signs of blood clot, x-ray showed no problems with your lungs, this is all very reassuring and suggest that this is not related to something serious.  Please take Naprosyn twice a day, ER for worsening symptoms, see your doctor this week for recheck.

## 2021-05-17 DIAGNOSIS — I10 Essential (primary) hypertension: Secondary | ICD-10-CM | POA: Diagnosis not present

## 2021-05-17 DIAGNOSIS — R0781 Pleurodynia: Secondary | ICD-10-CM | POA: Diagnosis not present

## 2021-05-25 DIAGNOSIS — I2584 Coronary atherosclerosis due to calcified coronary lesion: Secondary | ICD-10-CM | POA: Diagnosis not present

## 2021-05-25 DIAGNOSIS — N644 Mastodynia: Secondary | ICD-10-CM | POA: Diagnosis not present

## 2021-05-25 DIAGNOSIS — R0789 Other chest pain: Secondary | ICD-10-CM | POA: Diagnosis not present

## 2021-05-25 DIAGNOSIS — F17210 Nicotine dependence, cigarettes, uncomplicated: Secondary | ICD-10-CM | POA: Diagnosis not present

## 2021-05-25 DIAGNOSIS — I700 Atherosclerosis of aorta: Secondary | ICD-10-CM | POA: Diagnosis not present

## 2021-05-25 DIAGNOSIS — Z6827 Body mass index (BMI) 27.0-27.9, adult: Secondary | ICD-10-CM | POA: Diagnosis not present

## 2021-05-25 DIAGNOSIS — I2510 Atherosclerotic heart disease of native coronary artery without angina pectoris: Secondary | ICD-10-CM | POA: Diagnosis not present

## 2021-06-02 DIAGNOSIS — F331 Major depressive disorder, recurrent, moderate: Secondary | ICD-10-CM | POA: Diagnosis not present

## 2021-06-08 DIAGNOSIS — F331 Major depressive disorder, recurrent, moderate: Secondary | ICD-10-CM | POA: Diagnosis not present

## 2021-06-14 DIAGNOSIS — I700 Atherosclerosis of aorta: Secondary | ICD-10-CM | POA: Diagnosis not present

## 2021-06-14 DIAGNOSIS — R0781 Pleurodynia: Secondary | ICD-10-CM | POA: Diagnosis not present

## 2021-06-14 DIAGNOSIS — R0789 Other chest pain: Secondary | ICD-10-CM | POA: Diagnosis not present

## 2021-06-14 DIAGNOSIS — I2584 Coronary atherosclerosis due to calcified coronary lesion: Secondary | ICD-10-CM | POA: Diagnosis not present

## 2021-06-14 DIAGNOSIS — I10 Essential (primary) hypertension: Secondary | ICD-10-CM | POA: Diagnosis not present

## 2021-06-14 DIAGNOSIS — J439 Emphysema, unspecified: Secondary | ICD-10-CM | POA: Diagnosis not present

## 2021-06-14 DIAGNOSIS — I2510 Atherosclerotic heart disease of native coronary artery without angina pectoris: Secondary | ICD-10-CM | POA: Diagnosis not present

## 2021-06-14 DIAGNOSIS — N644 Mastodynia: Secondary | ICD-10-CM | POA: Diagnosis not present

## 2021-06-20 NOTE — Progress Notes (Signed)
CARDIOLOGY CONSULT NOTE       Patient ID: KEYUNDRA FANT MRN: [id number] DOB/AGE: 58-11-1960 58 y.o.  Admit date: (Not on file) Referring Physician: Noemi Chapel [facility] Primary Physician: [facility] Primary Cardiologist: new Reason for Consultation: Chest pain  Active Problems:   * No active hospital problems. *   HPI:  60 y.o. referred by Dr Sabra Heck post ER visit 05/09/21 for chest pain.  CRF;s include HLD And smoking  SSCP wakes her at night Middle of chest radiating to back worse with deep breathing and positional changes Sometimes radiates to right arm Exercise limited by tinnitus BP and HR seemed more elevated prior to ER visit She r/o had normal ECG and CXR  D dimer elevated but CTA negative for PE but did note aortic atherosclerosis and coronary calcium To my review scattered calcium mostly in LAD she is on crestor for her cholesterol   Note she had atypical chest pain in 2018 and had normal myovue EF 73%  F/U Dr Quintin Alto 05/25/21 continued with atypical sharp right sided chest/breast pain Rx with flexeril, tramadol No relief with prednisone/Naproxen Mammogram previously normal 03/30/21   Also has had some pain in recumbence improved with tums and water  She is sedentary Use to work at [employer] but on disability for tinnitus Lots of recent stress with friend drowning at [employer] hinting and 40 yo Daughter in [place] with car trouble    ROS All other systems reviewed and negative except as noted above  Past Medical History:  Diagnosis Date   Anxiety    Chronic kidney disease    kidney stones,blood in urine   Constipation - functional 01/20/2015   Degeneration of cartilage    left knee   Depression    GERD (gastroesophageal reflux disease)    Headache    Hot flashes 10/15/2013   Tinnitus 10/15/2013   Tinnitus of both ears     Family History  Problem Relation Age of Onset   Diabetes Mother    Hyperlipidemia Mother    Diabetes Father     Peripheral vascular disease Father    Diabetes Maternal Grandmother    Diabetes Maternal Grandfather    Colon cancer Neg Hx     Social History   Socioeconomic History   Marital status: Married    Spouse name: Not on file   Number of children: 1   Years of education: 12   Highest education level: Not on file  Occupational History   Not on file  Tobacco Use   Smoking status: Every Day    Packs/day: 1.00    Years: 30.00    Pack years: 30.00    Types: Cigarettes    Start date: 01/22/1977   Smokeless tobacco: Never  Vaping Use   Vaping Use: Never used  Substance and Sexual Activity   Alcohol use: No    Alcohol/week: 0.0 standard drinks   Drug use: No   Sexual activity: Yes    Birth control/protection: Other-see comments, Post-menopausal    Comment: vasectomy  Other Topics Concern   Not on file  Social History Narrative   Patient is married with one child.   Patient is right handed.   Patient has hs education.   Patient drinks 2-3 cups daily.   Social Determinants of Health   Financial Resource Strain: Not on file  Food Insecurity: Not on file  Transportation Needs: Not on file  Physical Activity: Not on file  Stress: Not on  file  Social Connections: Not on file  Intimate Partner Violence: Not on file    Past Surgical History:  Procedure Laterality Date   APPENDECTOMY     CESAREAN SECTION     COLONOSCOPY N/A 04/07/2016   Procedure: COLONOSCOPY;  Surgeon: Danie Binder, MD;  [facility];  Service: Endoscopy;  Laterality: N/A;  9:30 Am   COLONOSCOPY N/A 06/09/2016   Procedure: COLONOSCOPY;  Surgeon: Danie Binder, MD;  [facility];  Service: Endoscopy;  Laterality: N/A;  9:30 am   LAPAROSCOPIC APPENDECTOMY N/A 11/25/2014   Procedure: LAPAROSCOPIC APPENDECTOMY AND LAPAROSCOPIC EXCISION OF ABDOMINAL MASS;  Surgeon: Donnie Mesa, MD;  Location: [place];  Service: General;  Laterality: N/A;   lasix surgery     POLYPECTOMY  04/07/2016    Procedure: POLYPECTOMY;  Surgeon: Danie Binder, MD;  [facility];  Service: Endoscopy;;  colon   TONSILECTOMY, ADENOIDECTOMY, BILATERAL MYRINGOTOMY AND TUBES     TONSILLECTOMY        Current Outpatient Medications:    ASHWAGANDHA PO, Take 1 capsule by mouth daily., Disp: , Rfl:    cholecalciferol (VITAMIN D3) 25 MCG (1000 UNIT) tablet, Take 1,000 Units by mouth daily., Disp: , Rfl:    CRESTOR 10 MG tablet, Take 10 mg by mouth at bedtime. , Disp: , Rfl:    gabapentin (NEURONTIN) 100 MG capsule, Take 1 capsule by mouth 3 (three) times daily as needed for pain.  Arm pain, Disp: , Rfl:    lactobacillus acidophilus (BACID) TABS tablet, Take 2 tablets by mouth 3 (three) times daily., Disp: , Rfl:    LORazepam (ATIVAN) 1 MG tablet, Take 2 mg by mouth 2 (two) times daily., Disp: , Rfl:    Omega-3 Fatty Acids (FISH OIL) 1000 MG CAPS, Take 1 capsule by mouth daily., Disp: , Rfl:    traMADol (ULTRAM) 50 MG tablet, Take 50 mg by mouth every 6 (six) hours as needed for pain., Disp: , Rfl:    traZODone (DESYREL) 100 MG tablet, Take 300 mg by mouth at bedtime., Disp: , Rfl:     Physical Exam: Resp. rate 20, height 5\' 3"  (1.6 m), weight 158 lb (71.7 kg).    Affect appropriate Healthy:  appears stated age 60: normal Neck supple with no adenopathy JVP normal no bruits no thyromegaly Lungs clear with no wheezing and good diaphragmatic motion Heart:  S1/S2 no murmur, no rub, gallop or click PMI normal Abdomen: benighn, BS positve, no tenderness, no AAA no bruit.  No HSM or HJR Distal pulses intact with no bruits No edema Neuro non-focal Skin warm and dry No muscular weakness   Labs:   Lab Results  Component Value Date   WBC 12.5 (H) 05/09/2021   HGB 14.4 05/09/2021   HCT 41.2 05/09/2021   MCV 96.9 05/09/2021   PLT 256 05/09/2021   No results for input(s): NA, K, CL, CO2, BUN, CREATININE, CALCIUM, PROT, BILITOT, ALKPHOS, ALT, AST, GLUCOSE in the last 168 hours.  Invalid  input(s): LABALBU No results found for: CKTOTAL, CKMB, CKMBINDEX, TROPONINI No results found for: CHOL No results found for: HDL No results found for: LDLCALC No results found for: TRIG No results found for: CHOLHDL No results found for: LDLDIRECT    Radiology: No results found.  EKG: ST rate 108 otherwise normal    ASSESSMENT AND PLAN:   Chest Pain:  very atypical Resting seen in ER r/o and ECG no acute ST changes CT with  coronary calcium. Normal myovue in 2018  Shared decision making feel that calcium score with cardiac CTA best way to risk stratify. Lopressor 100 mg 2 hours before BMET today  HLD:  continue statin see above regarding LDL goal LDL 72 on most recent labs  Smoking:  a ppd since age 66 She indicates not smoking in the last month   Cardiac CTA PRN f/u if low risk   Signed: Jenkins Rouge 06/30/2021, 12:56 PM

## 2021-06-30 ENCOUNTER — Other Ambulatory Visit (HOSPITAL_COMMUNITY)
Admission: RE | Admit: 2021-06-30 | Discharge: 2021-06-30 | Disposition: A | Discharge status: Home / Self Care | Payer: PPO | Source: Ambulatory Visit | Attending: Cardiovascular Disease | Admitting: Cardiovascular Disease

## 2021-06-30 ENCOUNTER — Other Ambulatory Visit: Payer: Self-pay

## 2021-06-30 ENCOUNTER — Encounter: Payer: Self-pay | Admitting: Cardiovascular Disease

## 2021-06-30 ENCOUNTER — Ambulatory Visit: Payer: PPO | Admitting: Cardiovascular Disease

## 2021-06-30 VITALS — BP 128/80 | HR 99 | Resp 20 | Ht 63.0 in | Wt 158.0 lb

## 2021-06-30 DIAGNOSIS — R079 Chest pain, unspecified: Secondary | ICD-10-CM | POA: Diagnosis not present

## 2021-06-30 DIAGNOSIS — E782 Mixed hyperlipidemia: Secondary | ICD-10-CM

## 2021-06-30 DIAGNOSIS — F17200 Nicotine dependence, unspecified, uncomplicated: Secondary | ICD-10-CM

## 2021-06-30 LAB — BASIC METABOLIC PANEL
Anion gap: 8 (ref 5–15)
BUN: 15 mg/dL (ref 6–20)
CO2: 23 mmol/L (ref 22–32)
Calcium: 9.5 mg/dL (ref 8.9–10.3)
Chloride: 105 mmol/L (ref 98–111)
Creatinine, Ser: 0.63 mg/dL (ref 0.44–1.00)
GFR, Estimated: >60 mL/min (ref >60)
Glucose, Bld: 123 mg/dL — ABNORMAL HIGH (ref 70–99)
Potassium: 3.3 mmol/L — ABNORMAL LOW (ref 3.5–5.1)
Sodium: 136 mmol/L (ref 135–145)

## 2021-06-30 MED ORDER — METOPROLOL TARTRATE 100 MG PO TABS: 100.0000 mg | ORAL_TABLET | ORAL | 0 refills | Status: DC

## 2021-06-30 NOTE — Patient Instructions (Signed)
Medication Instructions:   Take Lopressor 100 mg Two Hours prior to CT Scan  *If you need a refill on your cardiac medications before your next appointment, please call your pharmacy*   Lab Work: Your physician recommends that you return for lab work in: Today   If you have labs (blood work) drawn today and your tests are completely normal, you will receive your results only by: [place] (if you have MyChart) OR A paper copy in the mail If you have any lab test that is abnormal or we need to change your treatment, we will call you to review the results.   Testing/Procedures: Coronary CTA    Follow-Up: At [facility], you and your health needs are our priority.  As part of our continuing mission to provide you with exceptional heart care, we have created designated Provider Care Teams.  These Care Teams include your primary Cardiologist (physician) and Advanced Practice Providers (APPs -  Physician Assistants and Nurse Practitioners) who all work together to provide you with the care you need, when you need it.  We recommend signing up for the patient portal called "MyChart".  Sign up information is provided on this After Visit Summary.  MyChart is used to connect with patients for Virtual Visits (Telemedicine).  Patients are able to view lab/test results, encounter notes, upcoming appointments, etc.  Non-urgent messages can be sent to your provider as well.   To learn more about what you can do with MyChart, go to [email or web address].    Your next appointment:    As Needed   The format for your next appointment:   In Person  Provider:   Jenkins Rouge, MD    Other Instructions Thank you for choosing [facility]!    Your cardiac CT will be scheduled at one of the below locations:   [facility] [address] [place], [place] [address] [phone]  [place] [address] [place], [place] [address] [phone]  If scheduled at [facility], please arrive at the [facility] main entrance (entrance A) of [facility] 30 minutes prior to test start time. You can use the FREE valet parking offered at the main entrance (encouraged to control the heart rate for the test) Proceed to the [facility] Radiology Department (first floor) to check-in and test prep.  If scheduled at [facility], please arrive 15 mins early for check-in and test prep.  Please follow these instructions carefully (unless otherwise directed):  On the Night Before the Test: Be sure to Drink plenty of water. Do not consume any caffeinated/decaffeinated beverages or chocolate 12 hours prior to your test. Do not take any antihistamines 12 hours prior to your test.  On the Day of the Test: Drink plenty of water until 1 hour prior to the test. Do not eat any food 4 hours prior to the test. You may take your regular medications prior to the test.  Take metoprolol (Lopressor) two hours prior to test. HOLD Furosemide/Hydrochlorothiazide morning of the test. FEMALES- please wear underwire-free bra if available, avoid dresses & tight clothing   *For Clinical Staff only. Please instruct patient the following:* Heart Rate Medication Recommendations for Cardiac CT  Resting HR < 50 bpm  No medication  Resting HR 50-60 bpm and BP >110/50 mmHG   Consider Metoprolol tartrate 25 mg PO 90-120 min prior to scan  Resting HR 60-65 bpm and BP >110/50  mmHG  Metoprolol tartrate 50 mg PO 90-120 minutes prior to scan   Resting HR > 65 bpm and BP >110/50 mmHG  Metoprolol tartrate 100 mg PO 90-120 minutes prior to scan  Consider Ivabradine 10-15 mg PO or a calcium channel blocker for resting HR >60 bpm and contraindication to metoprolol tartrate  Consider Ivabradine 10-15 mg PO in combination with metoprolol tartrate for HR >80 bpm         After the Test: Drink plenty  of water. After receiving IV contrast, you may experience a mild flushed feeling. This is normal. On occasion, you may experience a mild rash up to 24 hours after the test. This is not dangerous. If this occurs, you can take Benadryl 25 mg and increase your fluid intake. If you experience trouble breathing, this can be serious. If it is severe call 911 IMMEDIATELY. If it is mild, please call our office. If you take any of these medications: Glipizide/Metformin, Avandament, Glucavance, please do not take 48 hours after completing test unless otherwise instructed.  Please allow 2-4 weeks for scheduling of routine cardiac CTs. Some insurance companies require a pre-authorization which may delay scheduling of this test.   For non-scheduling related questions, please contact the cardiac imaging nurse navigator should you have any questions/concerns: Marchia Bond, Cardiac Imaging Nurse Navigator Gordy Clement, Cardiac Imaging Nurse Navigator [facility] Direct Office Dial: [phone]   For scheduling needs, including cancellations and rescheduling, please call [place], [phone].

## 2021-07-01 ENCOUNTER — Encounter: Payer: Self-pay | Admitting: *Deleted

## 2021-07-12 ENCOUNTER — Other Ambulatory Visit: Payer: PPO | Admitting: Adult Health

## 2021-07-13 ENCOUNTER — Ambulatory Visit (HOSPITAL_COMMUNITY): Payer: PPO

## 2021-07-18 ENCOUNTER — Ambulatory Visit (INDEPENDENT_AMBULATORY_CARE_PROVIDER_SITE_OTHER): Payer: PPO | Admitting: Adult Health

## 2021-07-18 ENCOUNTER — Other Ambulatory Visit (HOSPITAL_COMMUNITY)
Admission: RE | Admit: 2021-07-18 | Discharge: 2021-07-18 | Disposition: A | Discharge status: Home / Self Care | Payer: PPO | Source: Ambulatory Visit | Attending: Adult Health | Admitting: Adult Health

## 2021-07-18 ENCOUNTER — Encounter: Payer: Self-pay | Admitting: Adult Health

## 2021-07-18 ENCOUNTER — Other Ambulatory Visit: Payer: Self-pay

## 2021-07-18 VITALS — BP 117/72 | HR 96 | Ht 63.0 in | Wt 153.2 lb

## 2021-07-18 DIAGNOSIS — Z1151 Encounter for screening for human papillomavirus (HPV): Secondary | ICD-10-CM | POA: Diagnosis not present

## 2021-07-18 DIAGNOSIS — Z01419 Encounter for gynecological examination (general) (routine) without abnormal findings: Secondary | ICD-10-CM | POA: Insufficient documentation

## 2021-07-18 DIAGNOSIS — Z1211 Encounter for screening for malignant neoplasm of colon: Secondary | ICD-10-CM | POA: Diagnosis not present

## 2021-07-18 DIAGNOSIS — Z780 Asymptomatic menopausal state: Secondary | ICD-10-CM | POA: Diagnosis not present

## 2021-07-18 LAB — HEMOCCULT GUIAC POC 1CARD (OFFICE): Fecal Occult Blood, POC: NEGATIVE

## 2021-07-18 NOTE — Progress Notes (Signed)
Patient ID: Melanie Jimenez, female   DOB: 10-16-1960, 61 y.o.   MRN: [id number] History of Present Illness: Melanie Jimenez is a 61 year old white female, married, PM in for well woman gyn exam and wants pap this year. She is on DB for tinnitus, has had for years. No smoking in 60 days. PCP is [facility].   Current Medications, Allergies, Past Medical History, Past Surgical History, Family History and Social History were reviewed in [employer] record.     Review of Systems: Patient denies any headaches, hearing loss, fatigue, blurred vision, shortness of breath, chest pain, abdominal pain, problems with bowel movements, urination, or intercourse. No joint pain or mood swings.     Physical Exam:BP 117/72 (BP Location: Right Arm, Patient Position: Sitting, Cuff Size: Normal)    Pulse 96    Ht 5\' 3"  (1.6 m)    Wt 153 lb 3.2 oz (69.5 kg)    BMI 27.14 kg/m   General:  Well developed, well nourished, no acute distress Skin:  Warm and dry Neck:  Midline trachea, normal thyroid, good ROM, no lymphadenopathy Lungs; Clear to auscultation bilaterally Breast:  No dominant palpable mass, retraction, or nipple discharge Cardiovascular: Regular rate and rhythm Abdomen:  Soft, non tender, no hepatosplenomegaly Pelvic:  External genitalia is normal in appearance, no lesions.  The vagina is normal in appearance, pale with loss of rugae. Urethra has no lesions or masses. The cervix is smooth, pap with HR  HPV genotyping performed.  Uterus is felt to be normal size, shape, and contour.  No adnexal masses or tenderness noted.Bladder is non tender, no masses felt. Rectal: Good sphincter tone, no polyps, or hemorrhoids felt.  Hemoccult negative. Extremities/musculoskeletal:  No swelling or varicosities noted, no clubbing or cyanosis Psych:  No mood changes, alert and cooperative,seems happy AA is 0 Fall risk is low Depression screen [facility] 2/9 07/18/2021 07/17/2019 07/17/2019  Decreased Interest 3 3 3    Down, Depressed, Hopeless 3 3 3   PHQ - 2 Score 6 6 6   Altered sleeping 2 0 -  Tired, decreased energy 3 1 -  Change in appetite 2 1 -  Feeling bad or failure about yourself  1 0 -  Trouble concentrating 2 3 -  Moving slowly or fidgety/restless 1 0 -  Suicidal thoughts 0 0 -  PHQ-9 Score 17 11 -  Difficult doing work/chores - - -  Some encounter information is confidential and restricted. Go to Review Flowsheets activity to see all data.   On meds and sees [facility] in [place]. GAD 7 : Generalized Anxiety Score 07/18/2021  Nervous, Anxious, on Edge 3  Control/stop worrying 3  Worry too much - different things 3  Trouble relaxing 3  Restless 2  Easily annoyed or irritable 2  Afraid - awful might happen 3  Total GAD 7 Score 19  Some encounter information is confidential and restricted. Go to Review Flowsheets activity to see all data.    Upstream - 07/18/21 1350       Pregnancy Intention Screening   Does the patient want to become pregnant in the next year? No    Does the patient's partner want to become pregnant in the next year? No    Would the patient like to discuss contraceptive options today? No      Contraception Wrap Up   Current Method Vasectomy   pm   End Method Vasectomy   pm   Contraception Counseling Provided No  Examination chaperoned by Celene Squibb LPN  Impression and Plan: 1. Encounter for gynecological examination with Papanicolaou smear of cervix Pap sent Physical in 1 year Pap in 3 if normal Mammogram yearly Labs with PCP Colonoscopy per GI  2. Encounter for screening fecal occult blood testing   3. Postmenopause

## 2021-07-19 ENCOUNTER — Other Ambulatory Visit (HOSPITAL_COMMUNITY): Payer: Self-pay | Admitting: Emergency Medicine

## 2021-07-19 ENCOUNTER — Telehealth (HOSPITAL_COMMUNITY): Payer: Self-pay | Admitting: Emergency Medicine

## 2021-07-19 DIAGNOSIS — R079 Chest pain, unspecified: Secondary | ICD-10-CM

## 2021-07-19 MED ORDER — IVABRADINE HCL 5 MG PO TABS: 10.0000 mg | ORAL_TABLET | Freq: Once | ORAL | 0 refills | Status: AC

## 2021-07-19 MED ORDER — IVABRADINE HCL 5 MG PO TABS: 10.0000 mg | ORAL_TABLET | Freq: Once | ORAL | 0 refills | Status: DC

## 2021-07-19 NOTE — Telephone Encounter (Signed)
Reaching out to patient to offer assistance regarding upcoming cardiac imaging study; pt verbalizes understanding of appt date/time, parking situation and where to check in, pre-test NPO status and medications ordered, and verified current allergies; name and call back number provided for further questions should they arise Melanie Bond RN Navigator Cardiac Imaging [facility] [phone] office [phone] cell  L ARM IV ONLY Arrival 12:30p 100mg  metop + 10mg  ivabradine

## 2021-07-20 ENCOUNTER — Ambulatory Visit (HOSPITAL_COMMUNITY)
Admission: RE | Admit: 2021-07-20 | Discharge: 2021-07-20 | Disposition: A | Discharge status: Home / Self Care | Payer: PPO | Source: Ambulatory Visit | Attending: Cardiovascular Disease | Admitting: Cardiovascular Disease

## 2021-07-20 ENCOUNTER — Other Ambulatory Visit: Payer: Self-pay

## 2021-07-20 DIAGNOSIS — I2510 Atherosclerotic heart disease of native coronary artery without angina pectoris: Secondary | ICD-10-CM | POA: Diagnosis not present

## 2021-07-20 DIAGNOSIS — I700 Atherosclerosis of aorta: Secondary | ICD-10-CM | POA: Insufficient documentation

## 2021-07-20 DIAGNOSIS — R079 Chest pain, unspecified: Secondary | ICD-10-CM | POA: Insufficient documentation

## 2021-07-20 MED ORDER — IOHEXOL 350 MG/ML SOLN
95.0000 mL | Freq: Once | INTRAVENOUS | Status: AC | PRN
  Administered 2021-07-20: 95 mL via INTRAVENOUS

## 2021-07-20 MED ORDER — NITROGLYCERIN 0.4 MG SL SUBL
SUBLINGUAL_TABLET | SUBLINGUAL | Status: AC
  Filled 2021-07-20: qty 2

## 2021-07-20 MED ORDER — NITROGLYCERIN 0.4 MG SL SUBL
0.8000 mg | SUBLINGUAL_TABLET | Freq: Once | SUBLINGUAL | Status: AC
  Administered 2021-07-20: 0.8 mg via SUBLINGUAL

## 2021-07-21 ENCOUNTER — Telehealth: Payer: Self-pay | Admitting: Cardiovascular Disease

## 2021-07-21 DIAGNOSIS — E7800 Pure hypercholesterolemia, unspecified: Secondary | ICD-10-CM | POA: Diagnosis not present

## 2021-07-21 DIAGNOSIS — R739 Hyperglycemia, unspecified: Secondary | ICD-10-CM | POA: Diagnosis not present

## 2021-07-21 DIAGNOSIS — E7801 Familial hypercholesterolemia: Secondary | ICD-10-CM | POA: Diagnosis not present

## 2021-07-21 DIAGNOSIS — I10 Essential (primary) hypertension: Secondary | ICD-10-CM | POA: Diagnosis not present

## 2021-07-21 DIAGNOSIS — R5383 Other fatigue: Secondary | ICD-10-CM | POA: Diagnosis not present

## 2021-07-21 NOTE — Telephone Encounter (Signed)
Patient is requesting to discuss her CT results.

## 2021-07-22 NOTE — Telephone Encounter (Signed)
Returned call to pt and results reviewed.

## 2021-07-25 DIAGNOSIS — F411 Generalized anxiety disorder: Secondary | ICD-10-CM | POA: Diagnosis not present

## 2021-07-25 DIAGNOSIS — F331 Major depressive disorder, recurrent, moderate: Secondary | ICD-10-CM | POA: Diagnosis not present

## 2021-07-25 LAB — CYTOLOGY - PAP
Comment: NEGATIVE
Diagnosis: NEGATIVE
High risk HPV: NEGATIVE

## 2021-08-09 DIAGNOSIS — H9313 Tinnitus, bilateral: Secondary | ICD-10-CM | POA: Diagnosis not present

## 2021-08-09 DIAGNOSIS — I2510 Atherosclerotic heart disease of native coronary artery without angina pectoris: Secondary | ICD-10-CM | POA: Diagnosis not present

## 2021-08-09 DIAGNOSIS — Z87442 Personal history of urinary calculi: Secondary | ICD-10-CM | POA: Diagnosis not present

## 2021-08-09 DIAGNOSIS — J439 Emphysema, unspecified: Secondary | ICD-10-CM | POA: Diagnosis not present

## 2021-08-09 DIAGNOSIS — R739 Hyperglycemia, unspecified: Secondary | ICD-10-CM | POA: Diagnosis not present

## 2021-08-09 DIAGNOSIS — I10 Essential (primary) hypertension: Secondary | ICD-10-CM | POA: Diagnosis not present

## 2021-08-09 DIAGNOSIS — R519 Headache, unspecified: Secondary | ICD-10-CM | POA: Diagnosis not present

## 2021-08-09 DIAGNOSIS — I700 Atherosclerosis of aorta: Secondary | ICD-10-CM | POA: Diagnosis not present

## 2021-09-05 DIAGNOSIS — F411 Generalized anxiety disorder: Secondary | ICD-10-CM | POA: Diagnosis not present

## 2021-09-05 DIAGNOSIS — F331 Major depressive disorder, recurrent, moderate: Secondary | ICD-10-CM | POA: Diagnosis not present

## 2021-09-09 DIAGNOSIS — R197 Diarrhea, unspecified: Secondary | ICD-10-CM | POA: Diagnosis not present

## 2021-09-20 DIAGNOSIS — Z6827 Body mass index (BMI) 27.0-27.9, adult: Secondary | ICD-10-CM | POA: Diagnosis not present

## 2021-09-20 DIAGNOSIS — R197 Diarrhea, unspecified: Secondary | ICD-10-CM | POA: Diagnosis not present

## 2021-09-20 DIAGNOSIS — F17210 Nicotine dependence, cigarettes, uncomplicated: Secondary | ICD-10-CM | POA: Diagnosis not present

## 2021-10-19 DIAGNOSIS — F331 Major depressive disorder, recurrent, moderate: Secondary | ICD-10-CM | POA: Diagnosis not present

## 2021-11-22 DIAGNOSIS — M778 Other enthesopathies, not elsewhere classified: Secondary | ICD-10-CM | POA: Diagnosis not present

## 2021-11-22 DIAGNOSIS — M79672 Pain in left foot: Secondary | ICD-10-CM | POA: Diagnosis not present

## 2021-11-29 DIAGNOSIS — F331 Major depressive disorder, recurrent, moderate: Secondary | ICD-10-CM | POA: Diagnosis not present

## 2021-12-13 DIAGNOSIS — M79672 Pain in left foot: Secondary | ICD-10-CM | POA: Diagnosis not present

## 2021-12-19 DIAGNOSIS — M19072 Primary osteoarthritis, left ankle and foot: Secondary | ICD-10-CM | POA: Diagnosis not present

## 2021-12-19 DIAGNOSIS — M25375 Other instability, left foot: Secondary | ICD-10-CM | POA: Diagnosis not present

## 2021-12-19 DIAGNOSIS — M79672 Pain in left foot: Secondary | ICD-10-CM | POA: Diagnosis not present

## 2022-01-16 DIAGNOSIS — F331 Major depressive disorder, recurrent, moderate: Secondary | ICD-10-CM | POA: Diagnosis not present

## 2022-01-16 DIAGNOSIS — F411 Generalized anxiety disorder: Secondary | ICD-10-CM | POA: Diagnosis not present

## 2022-01-31 DIAGNOSIS — E875 Hyperkalemia: Secondary | ICD-10-CM | POA: Diagnosis not present

## 2022-01-31 DIAGNOSIS — R739 Hyperglycemia, unspecified: Secondary | ICD-10-CM | POA: Diagnosis not present

## 2022-01-31 DIAGNOSIS — E7801 Familial hypercholesterolemia: Secondary | ICD-10-CM | POA: Diagnosis not present

## 2022-02-08 DIAGNOSIS — Z87891 Personal history of nicotine dependence: Secondary | ICD-10-CM | POA: Diagnosis not present

## 2022-02-08 DIAGNOSIS — R519 Headache, unspecified: Secondary | ICD-10-CM | POA: Diagnosis not present

## 2022-02-08 DIAGNOSIS — R931 Abnormal findings on diagnostic imaging of heart and coronary circulation: Secondary | ICD-10-CM | POA: Diagnosis not present

## 2022-02-08 DIAGNOSIS — I700 Atherosclerosis of aorta: Secondary | ICD-10-CM | POA: Diagnosis not present

## 2022-02-08 DIAGNOSIS — R739 Hyperglycemia, unspecified: Secondary | ICD-10-CM | POA: Diagnosis not present

## 2022-02-08 DIAGNOSIS — J439 Emphysema, unspecified: Secondary | ICD-10-CM | POA: Diagnosis not present

## 2022-02-08 DIAGNOSIS — F331 Major depressive disorder, recurrent, moderate: Secondary | ICD-10-CM | POA: Diagnosis not present

## 2022-02-08 DIAGNOSIS — Z87442 Personal history of urinary calculi: Secondary | ICD-10-CM | POA: Diagnosis not present

## 2022-02-08 DIAGNOSIS — I10 Essential (primary) hypertension: Secondary | ICD-10-CM | POA: Diagnosis not present

## 2022-02-08 DIAGNOSIS — R7301 Impaired fasting glucose: Secondary | ICD-10-CM | POA: Diagnosis not present

## 2022-02-08 DIAGNOSIS — I2584 Coronary atherosclerosis due to calcified coronary lesion: Secondary | ICD-10-CM | POA: Diagnosis not present

## 2022-03-10 DIAGNOSIS — F17210 Nicotine dependence, cigarettes, uncomplicated: Secondary | ICD-10-CM | POA: Diagnosis not present

## 2022-03-10 DIAGNOSIS — J439 Emphysema, unspecified: Secondary | ICD-10-CM | POA: Diagnosis not present

## 2022-03-10 DIAGNOSIS — N200 Calculus of kidney: Secondary | ICD-10-CM | POA: Diagnosis not present

## 2022-03-10 DIAGNOSIS — I700 Atherosclerosis of aorta: Secondary | ICD-10-CM | POA: Diagnosis not present

## 2022-03-10 DIAGNOSIS — Z87891 Personal history of nicotine dependence: Secondary | ICD-10-CM | POA: Diagnosis not present

## 2022-03-13 DIAGNOSIS — F331 Major depressive disorder, recurrent, moderate: Secondary | ICD-10-CM | POA: Diagnosis not present

## 2022-03-13 DIAGNOSIS — F411 Generalized anxiety disorder: Secondary | ICD-10-CM | POA: Diagnosis not present

## 2022-03-29 DIAGNOSIS — F331 Major depressive disorder, recurrent, moderate: Secondary | ICD-10-CM | POA: Diagnosis not present

## 2022-05-01 ENCOUNTER — Other Ambulatory Visit (HOSPITAL_COMMUNITY): Payer: Self-pay | Admitting: Adult Health

## 2022-05-01 DIAGNOSIS — Z1231 Encounter for screening mammogram for malignant neoplasm of breast: Secondary | ICD-10-CM

## 2022-05-02 DIAGNOSIS — I788 Other diseases of capillaries: Secondary | ICD-10-CM | POA: Diagnosis not present

## 2022-05-02 DIAGNOSIS — L218 Other seborrheic dermatitis: Secondary | ICD-10-CM | POA: Diagnosis not present

## 2022-05-02 DIAGNOSIS — D225 Melanocytic nevi of trunk: Secondary | ICD-10-CM | POA: Diagnosis not present

## 2022-05-11 ENCOUNTER — Ambulatory Visit (HOSPITAL_COMMUNITY)
Admission: RE | Admit: 2022-05-11 | Discharge: 2022-05-11 | Disposition: A | Discharge status: Home / Self Care | Payer: PPO | Source: Ambulatory Visit | Attending: Adult Health | Admitting: Adult Health

## 2022-05-11 DIAGNOSIS — Z1231 Encounter for screening mammogram for malignant neoplasm of breast: Secondary | ICD-10-CM | POA: Insufficient documentation

## 2022-06-14 DIAGNOSIS — F331 Major depressive disorder, recurrent, moderate: Secondary | ICD-10-CM | POA: Diagnosis not present

## 2022-06-19 NOTE — Progress Notes (Deleted)
CARDIOLOGY CONSULT NOTE       Patient ID: Melanie Jimenez MRN: [id number] DOB/AGE: 1960/12/06 61 y.o.  Admit date: (Not on file) Referring Physician: [facility] Primary Physician: [facility] Primary Cardiologist: Johnsie Cancel    HPI:  61 y.o. referred by Dr Sabra Heck post ER visit 05/09/21 for chest pain. First seen by me 06/30/21  CRF;s include HLD And smoking  SSCP wakes her at night Middle of chest radiating to back worse with deep breathing and positional changes Sometimes radiates to right arm Exercise limited by tinnitus BP and HR seemed more elevated prior to ER visit She r/o had normal ECG and CXR  D dimer elevated but CTA negative for PE but did note aortic atherosclerosis and coronary calcium To my review scattered calcium mostly in LAD she is on crestor for her cholesterol   Note she had atypical chest pain in 2018 and had normal myovue EF 73%  F/U Dr Quintin Alto 05/25/21 continued with atypical sharp right sided chest/breast pain Rx with flexeril, tramadol No relief with prednisone/Naproxen Mammogram previously normal 03/30/21   Also has had some pain in recumbence improved with tums and water  She is sedentary Use to work at [employer] but on disability for tinnitus Lots of recent stress with friend drowning at [place] hinting and 46 yo Daughter in [place] with car trouble    Cardiac CTA done 07/20/21 calcium score 183 , 96 th percentile CAD RADS 2 non obstructive dx 25-49% proximal LAD   ***  ROS All other systems reviewed and negative except as noted above  Past Medical History:  Diagnosis Date   Anxiety    Chronic kidney disease    kidney stones,blood in urine   Constipation - functional 01/20/2015   Degeneration of cartilage    left knee   Depression    GERD (gastroesophageal reflux disease)    Headache    Hot flashes 10/15/2013   Tinnitus 10/15/2013   Tinnitus of both ears     Family History  Problem Relation Age of Onset    Diabetes Mother    Hyperlipidemia Mother    Diabetes Father    Peripheral vascular disease Father    Diabetes Maternal Grandmother    Diabetes Maternal Grandfather    Colon cancer Neg Hx     Social History   Socioeconomic History   Marital status: Married    Spouse name: Not on file   Number of children: 1   Years of education: 12   Highest education level: Not on file  Occupational History   Not on file  Tobacco Use   Smoking status: Former    Packs/day: 1.00    Years: 30.00    Total pack years: 30.00    Types: Cigarettes    Start date: 01/22/1977   Smokeless tobacco: Never  Vaping Use   Vaping Use: Never used  Substance and Sexual Activity   Alcohol use: No    Alcohol/week: 0.0 standard drinks of alcohol   Drug use: No   Sexual activity: Yes    Birth control/protection: Other-see comments, Post-menopausal    Comment: vasectomy  Other Topics Concern   Not on file  Social History Narrative   Patient is married with one child.   Patient is right handed.   Patient has hs education.   Patient drinks 2-3 cups daily.   Social Determinants of Health   Financial Resource Strain: Low Risk  (07/18/2021)   Overall Financial Resource  Strain (CARDIA)    Difficulty of Paying Living Expenses: Not hard at all  Food Insecurity: No Food Insecurity (07/18/2021)   Hunger Vital Sign    Worried About Running Out of Food in the Last Year: Never true    Ran Out of Food in the Last Year: Never true  Transportation Needs: No Transportation Needs (07/18/2021)   PRAPARE - Hydrologist (Medical): No    Lack of Transportation (Non-Medical): No  Physical Activity: Insufficiently Active (07/18/2021)   Exercise Vital Sign    Days of Exercise per Week: 2 days    Minutes of Exercise per Session: 10 min  Stress: Stress Concern Present (07/18/2021)   [place]    Feeling of Stress : Very much  Social  Connections: Moderately Isolated (07/18/2021)   Social Connection and Isolation Panel [NHANES]    Frequency of Communication with Friends and Family: Twice a week    Frequency of Social Gatherings with Friends and Family: Once a week    Attends Religious Services: Never    Marine scientist or Organizations: No    Attends Archivist Meetings: Never    Marital Status: Married  Human resources officer Violence: Not At Risk (07/18/2021)   Humiliation, Afraid, Rape, and Kick questionnaire    Fear of Current or Ex-Partner: No    Emotionally Abused: No    Physically Abused: No    Sexually Abused: No    Past Surgical History:  Procedure Laterality Date   APPENDECTOMY     CESAREAN SECTION     COLONOSCOPY N/A 04/07/2016   Procedure: COLONOSCOPY;  Surgeon: Danie Binder, MD;  [facility];  Service: Endoscopy;  Laterality: N/A;  9:30 Am   COLONOSCOPY N/A 06/09/2016   Procedure: COLONOSCOPY;  Surgeon: Danie Binder, MD;  [facility];  Service: Endoscopy;  Laterality: N/A;  9:30 am   LAPAROSCOPIC APPENDECTOMY N/A 11/25/2014   Procedure: LAPAROSCOPIC APPENDECTOMY AND LAPAROSCOPIC EXCISION OF ABDOMINAL MASS;  Surgeon: Donnie Mesa, MD;  Location: [facility];  Service: General;  Laterality: N/A;   lasix surgery     POLYPECTOMY  04/07/2016   Procedure: POLYPECTOMY;  Surgeon: Danie Binder, MD;  [facility];  Service: Endoscopy;;  colon   TONSILECTOMY, ADENOIDECTOMY, BILATERAL MYRINGOTOMY AND TUBES     TONSILLECTOMY        Current Outpatient Medications:    amLODipine (NORVASC) 5 MG tablet, Take 5 mg by mouth daily., Disp: , Rfl:    ASHWAGANDHA PO, Take 1 capsule by mouth daily., Disp: , Rfl:    cholecalciferol (VITAMIN D3) 25 MCG (1000 UNIT) tablet, Take 1,000 Units by mouth daily., Disp: , Rfl:    CRESTOR 10 MG tablet, Take 10 mg by mouth at bedtime. , Disp: , Rfl:    gabapentin (NEURONTIN) 100 MG capsule, Take 1 capsule by mouth 3 (three) times daily as  needed for pain.  Arm pain, Disp: , Rfl:    lactobacillus acidophilus (BACID) TABS tablet, Take 2 tablets by mouth 3 (three) times daily., Disp: , Rfl:    LORazepam (ATIVAN) 1 MG tablet, Take 2 mg by mouth 2 (two) times daily., Disp: , Rfl:    Omega-3 Fatty Acids (FISH OIL) 1000 MG CAPS, Take 1 capsule by mouth daily., Disp: , Rfl:    traMADol (ULTRAM) 50 MG tablet, Take 50 mg by mouth every 6 (six) hours as needed for pain., Disp: ,  Rfl:    traZODone (DESYREL) 100 MG tablet, Take 300 mg by mouth at bedtime., Disp: , Rfl:     Physical Exam: There were no vitals taken for this visit.    Affect appropriate Healthy:  appears stated age 26: normal Neck supple with no adenopathy JVP normal no bruits no thyromegaly Lungs clear with no wheezing and good diaphragmatic motion Heart:  S1/S2 no murmur, no rub, gallop or click PMI normal Abdomen: benighn, BS positve, no tenderness, no AAA no bruit.  No HSM or HJR Distal pulses intact with no bruits No edema Neuro non-focal Skin warm and dry No muscular weakness   Labs:   Lab Results  Component Value Date   WBC 12.5 (H) 05/09/2021   HGB 14.4 05/09/2021   HCT 41.2 05/09/2021   MCV 96.9 05/09/2021   PLT 256 05/09/2021   No results for input(s): "NA", "K", "CL", "CO2", "BUN", "CREATININE", "CALCIUM", "PROT", "BILITOT", "ALKPHOS", "ALT", "AST", "GLUCOSE" in the last 168 hours.  Invalid input(s): "LABALBU" No results found for: "CKTOTAL", "CKMB", "CKMBINDEX", "TROPONINI" No results found for: "CHOL" No results found for: "HDL" No results found for: "LDLCALC" No results found for: "TRIG" No results found for: "CHOLHDL" No results found for: "LDLDIRECT"    Radiology: No results found.  EKG: ST rate 108 otherwise normal    ASSESSMENT AND PLAN:   Chest Pain:  very atypical Resting seen in ER r/o and ECG no acute ST changes CT with coronary calcium. Normal myovue in 2018  Cardiac CTA 07/20/21 low risk see above CAD RADS 2 25-49%  proximal LAD stenosis High calcium score for age ASA and statin  HLD:  continue statin see above regarding LDL goal LDL 72 on most recent labs  Smoking:  a ppd since age 76  Over read lung fields on cardiac CTA no lesions just atelectasis *** HTN:  continue norvasc and low sodium DASH diet    F/U in a year   Signed: Jenkins Rouge 06/19/2022, 7:56 PM

## 2022-06-28 ENCOUNTER — Ambulatory Visit: Payer: PPO | Admitting: Cardiovascular Disease

## 2022-06-29 IMAGING — CR DG CHEST 2V
2 series · 2 of 2 positions shown · non-contrast
Comparison: 02/24/2021 CT from [HOSPITAL] [facility].

CLINICAL DATA: Chest pain

EXAM:
CHEST - 2 VIEW

[w pa chest]
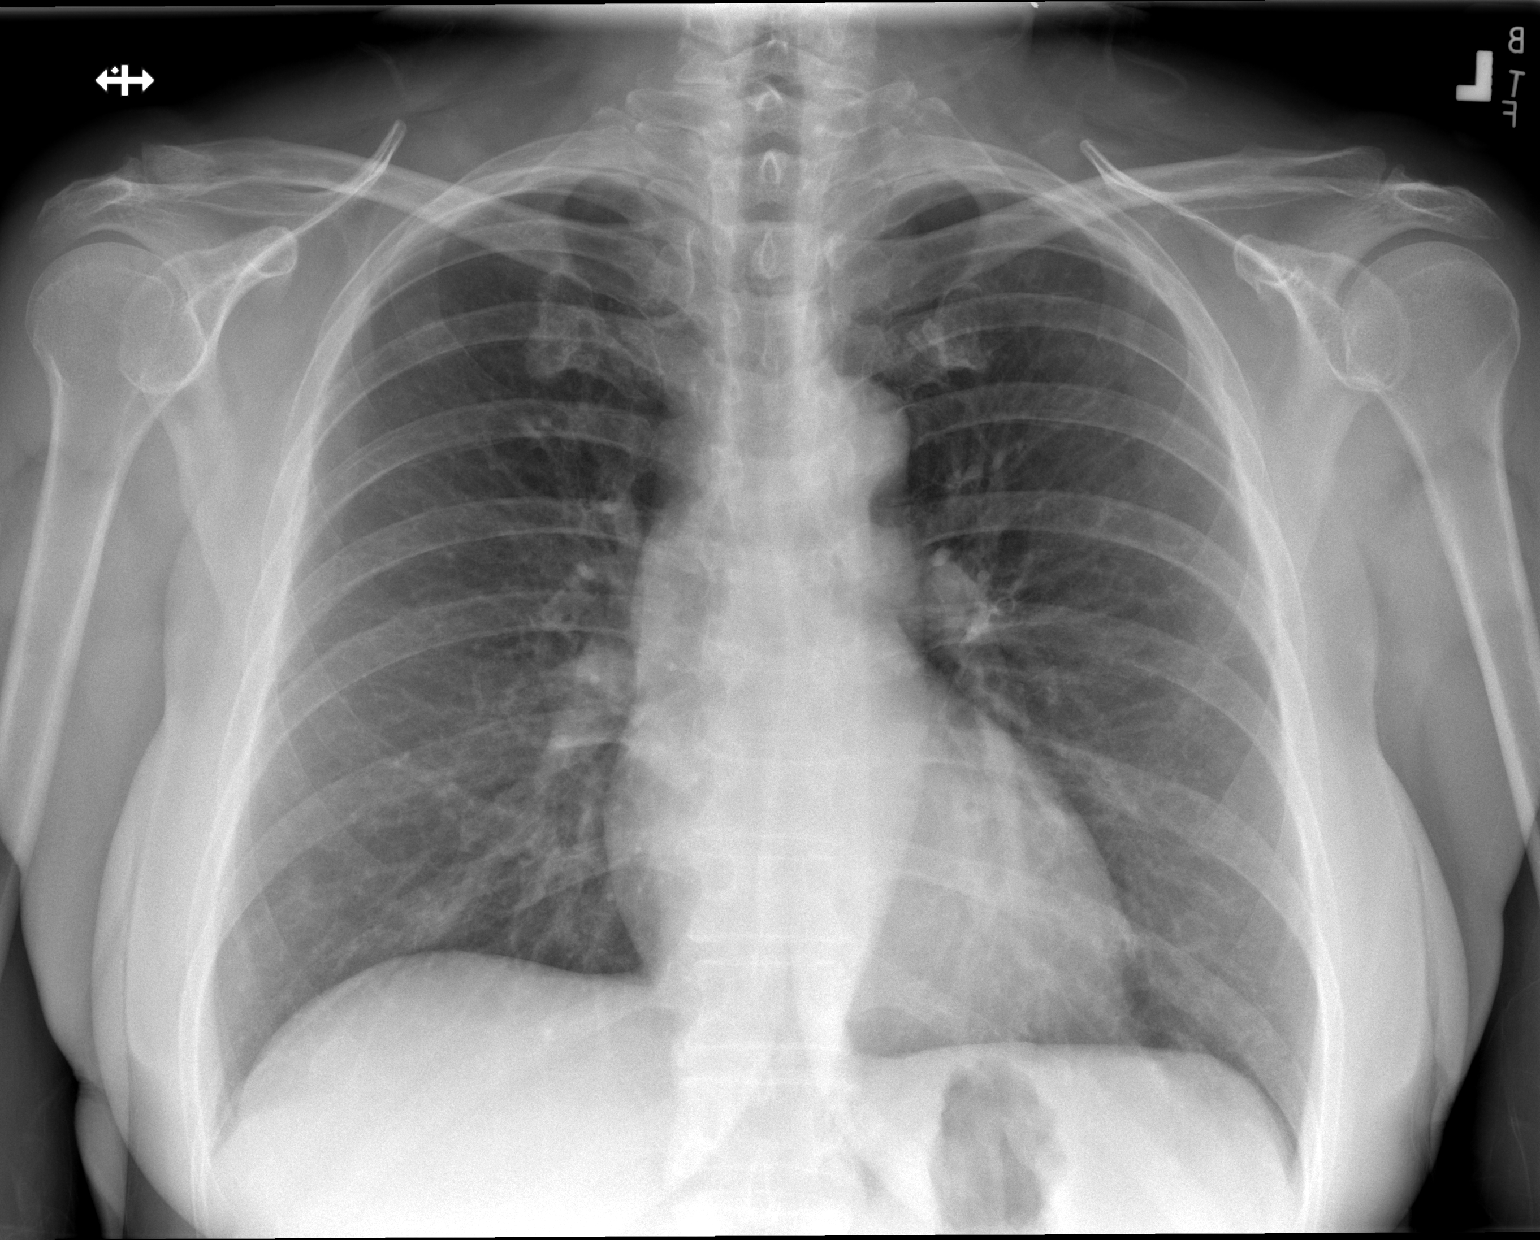

[w chest lat]
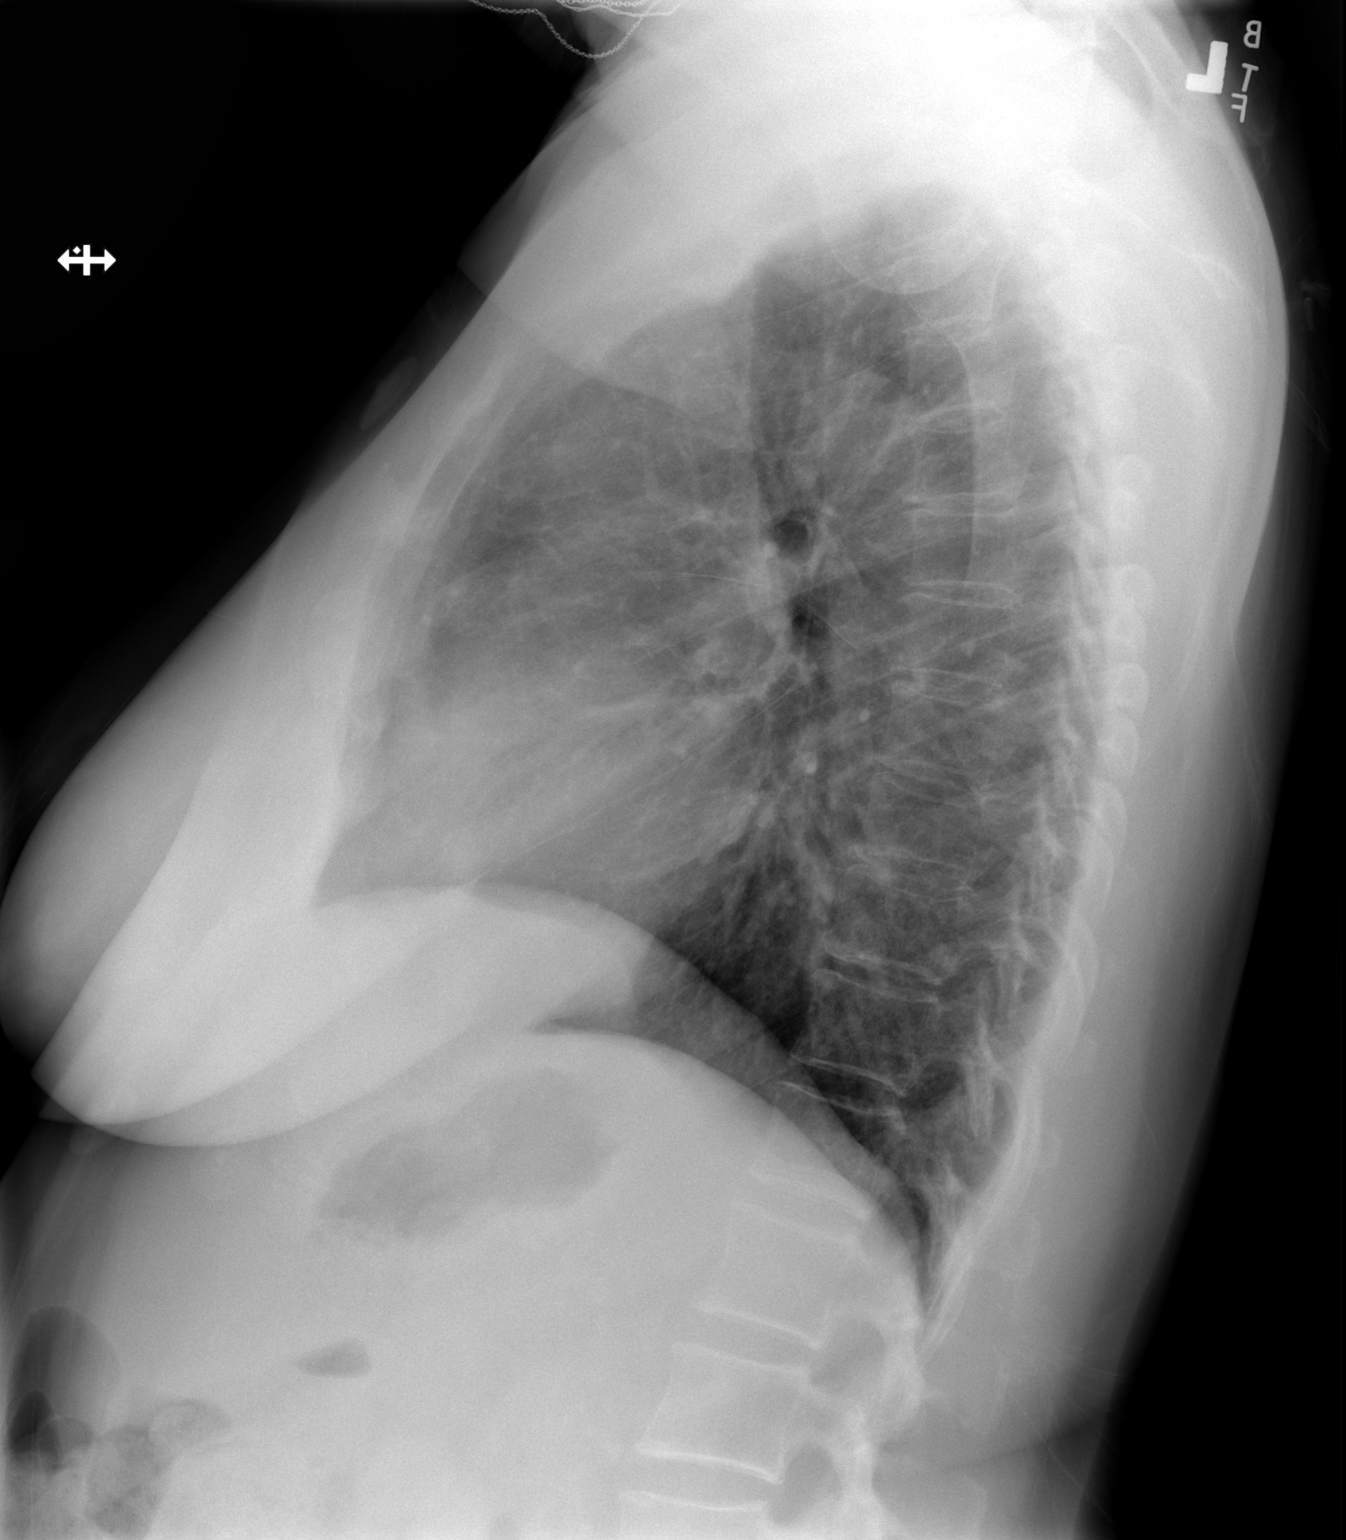

[2 of 2 positions shown; findings below may reference images not displayed]

FINDINGS: Midline trachea. Normal heart size and mediastinal contours. No
pleural effusion or pneumothorax. Diffuse peribronchial thickening.
IMPRESSION: No acute cardiopulmonary disease.

Peribronchial thickening which may relate to chronic bronchitis or
smoking.

## 2022-07-31 DIAGNOSIS — F331 Major depressive disorder, recurrent, moderate: Secondary | ICD-10-CM | POA: Diagnosis not present

## 2022-08-04 ENCOUNTER — Ambulatory Visit: Payer: PPO | Admitting: Cardiovascular Disease

## 2022-08-08 DIAGNOSIS — R7301 Impaired fasting glucose: Secondary | ICD-10-CM | POA: Diagnosis not present

## 2022-08-08 DIAGNOSIS — E7801 Familial hypercholesterolemia: Secondary | ICD-10-CM | POA: Diagnosis not present

## 2022-08-08 DIAGNOSIS — K219 Gastro-esophageal reflux disease without esophagitis: Secondary | ICD-10-CM | POA: Diagnosis not present

## 2022-08-21 ENCOUNTER — Ambulatory Visit: Payer: PPO | Admitting: Adult Health

## 2022-08-22 DIAGNOSIS — R931 Abnormal findings on diagnostic imaging of heart and coronary circulation: Secondary | ICD-10-CM | POA: Diagnosis not present

## 2022-08-22 DIAGNOSIS — E7849 Other hyperlipidemia: Secondary | ICD-10-CM | POA: Diagnosis not present

## 2022-08-22 DIAGNOSIS — Z87891 Personal history of nicotine dependence: Secondary | ICD-10-CM | POA: Diagnosis not present

## 2022-08-22 DIAGNOSIS — I2584 Coronary atherosclerosis due to calcified coronary lesion: Secondary | ICD-10-CM | POA: Diagnosis not present

## 2022-08-22 DIAGNOSIS — J439 Emphysema, unspecified: Secondary | ICD-10-CM | POA: Diagnosis not present

## 2022-08-22 DIAGNOSIS — R519 Headache, unspecified: Secondary | ICD-10-CM | POA: Diagnosis not present

## 2022-08-22 DIAGNOSIS — I700 Atherosclerosis of aorta: Secondary | ICD-10-CM | POA: Diagnosis not present

## 2022-08-22 DIAGNOSIS — R739 Hyperglycemia, unspecified: Secondary | ICD-10-CM | POA: Diagnosis not present

## 2022-08-22 DIAGNOSIS — R7301 Impaired fasting glucose: Secondary | ICD-10-CM | POA: Diagnosis not present

## 2022-08-22 DIAGNOSIS — Z0001 Encounter for general adult medical examination with abnormal findings: Secondary | ICD-10-CM | POA: Diagnosis not present

## 2022-08-22 DIAGNOSIS — I10 Essential (primary) hypertension: Secondary | ICD-10-CM | POA: Diagnosis not present

## 2022-08-22 DIAGNOSIS — I2510 Atherosclerotic heart disease of native coronary artery without angina pectoris: Secondary | ICD-10-CM | POA: Diagnosis not present

## 2022-09-01 NOTE — Progress Notes (Signed)
Cardiology Office Note:    Date:  09/12/2022   ID:  Melanie Jimenez, DOB 1961/03/09, MRN [id number]  PCP:  [facility] Cardiologist:  Jenkins Rouge, MD     Referring MD: [facility]   Chief Complaint:  Follow-up     History of Present Illness:   Melanie Jimenez is a 62 y.o. female with  history of  tobacco abuse, atypical chest pain 2018 normal NST.  Patient saw Dr. Johnsie Cancel 07/2021 with atypical chest pain. Coronary CTA calcium score 183 with mild non-obstructive CAD and mild atherosclerosis of proximal ascending aorta. Crestor and ASA added.    Patient comes in for f/u. Is struggling with tinnitus. Has seen 5 different specialists for it. Not a lot of exercise because an increase in her HR makes tinnitus worse.Can walk 1/2 mile.  On ativan and paxil that has helped some. Had labs drawn a couple weeks ago by PCP. Quit smoking 1.3 yrs ago.      Past Medical History:  Diagnosis Date   Anxiety    Chronic kidney disease    kidney stones,blood in urine   Constipation - functional 01/20/2015   Degeneration of cartilage    left knee   Depression    GERD (gastroesophageal reflux disease)    Headache    Hot flashes 10/15/2013   Tinnitus 10/15/2013   Tinnitus of both ears    Current Medications: Current Meds  Medication Sig   aspirin EC 81 MG tablet Take 81 mg by mouth daily.   cholecalciferol (VITAMIN D3) 25 MCG (1000 UNIT) tablet Take 1,000 Units by mouth daily.   CRESTOR 10 MG tablet Take 10 mg by mouth at bedtime.    Eszopiclone 3 MG TABS Take 3 mg by mouth at bedtime.   gabapentin (NEURONTIN) 100 MG capsule Take 1 capsule by mouth 3 (three) times daily as needed for pain.  Arm pain   LORazepam (ATIVAN) 1 MG tablet Take 2 mg by mouth 2 (two) times daily.   meloxicam (MOBIC) 15 MG tablet Take 15 mg by mouth as needed.   Omega-3 Fatty Acids (FISH OIL) 1000 MG CAPS Take 1 capsule by mouth daily.   PARoxetine (PAXIL) 20 MG  tablet Take 20 mg by mouth at bedtime.    Allergies:   Bupropion   Social History   Tobacco Use   Smoking status: Former    Packs/day: 1.00    Years: 30.00    Total pack years: 30.00    Types: Cigarettes    Start date: 01/22/1977   Smokeless tobacco: Never  Vaping Use   Vaping Use: Never used  Substance Use Topics   Alcohol use: No    Alcohol/week: 0.0 standard drinks of alcohol   Drug use: No    Family Hx: The patient's family history includes Diabetes in her father, maternal grandfather, maternal grandmother, and mother; Hyperlipidemia in her mother; Peripheral vascular disease in her father. There is no history of Colon cancer.  ROS     Physical Exam:    VS:  BP 128/82   Pulse 78   Ht '5\' 4"'$  (1.626 m)   Wt 157 lb 6.4 oz (71.4 kg)   SpO2 97%   BMI 27.02 kg/m     Wt Readings from Last 3 Encounters:  09/12/22 157 lb 6.4 oz (71.4 kg)  07/18/21 153 lb 3.2 oz (69.5 kg)  06/30/21 158 lb (71.7 kg)    Physical Exam  GEN: Well  nourished, well developed, in no acute distress  Neck: Right carotid bruit, no JVD,  or masses Cardiac:RRR; 1/6 systolic murmur Respiratory:  clear to auscultation bilaterally, normal work of breathing GI: soft, nontender, nondistended, + BS Ext: without cyanosis, clubbing, or edema, Good distal pulses bilaterally Neuro:  Alert and Oriented x 3,  Psych: euthymic mood, full affect        EKGs/Labs/Other Test Reviewed:    EKG:  EKG is   ordered today.  The ekg ordered today demonstrates    Recent Labs: No results found for requested labs within last 365 days.   Recent Lipid Panel No results for input(s): "CHOL", "TRIG", "HDL", "VLDL", "LDLCALC", "LDLDIRECT" in the last 8760 hours.   Prior CV Studies:   Coronary CTA 07/2021 Coronary Calcium Score:   Left main: 0   Left anterior descending artery: 39.3   Left circumflex artery: 60.3   Right coronary artery: 83.1   Total: 183   Percentile: 96   Other findings:   Normal  pulmonary vein drainage into the left atrium.   Normal left atrial appendage without a thrombus.   Normal size of the pulmonary artery.   IMPRESSION: 1. Coronary calcium score of 183. This was 24 percentile for age and sex matched control.   2. Normal coronary origin with right dominance.   3. CAD-RADS 2. Mild non-obstructive CAD (25-49%). Consider non-atherosclerotic causes of chest pain. Consider preventive therapy and risk factor modification.   4. Mild atherosclerosis of the proximal ascending aorta.     Electronically Signed   By: Berniece Salines D.O.   On: 07/20/2021 16:21     Risk Assessment/Calculations/Metrics:              ASSESSMENT & PLAN:   No problem-specific Assessment & Plan notes found for this encounter.   Non-obstructive CAD on Coronary CTA 07/2021 on crestor and ASA-no angina. Labs done by PCP TC 137, HDL 37, LDL 78.   Tobacco abuse-quit smoking 1.3 yrs ago!!  HLD on crestor see above values from 08/2022 on [facility]  Right carotid bruit-check dopplers.            Dispo:  No follow-ups on file.   Medication Adjustments/Labs and Tests Ordered: Current medicines are reviewed at length with the patient today.  Concerns regarding medicines are outlined above.  Tests Ordered: Orders Placed This Encounter  Procedures   EKG 12-Lead   VAS US CAROTID   Medication Changes: No orders of the defined types were placed in this encounter.  Sumner Boast, PA-C  09/12/2022 1:07 PM    [address], [place], [place]  [address] Phone: [phone]; Fax: [phone]

## 2022-09-04 DIAGNOSIS — M8589 Other specified disorders of bone density and structure, multiple sites: Secondary | ICD-10-CM | POA: Diagnosis not present

## 2022-09-04 DIAGNOSIS — M810 Age-related osteoporosis without current pathological fracture: Secondary | ICD-10-CM | POA: Diagnosis not present

## 2022-09-12 ENCOUNTER — Encounter: Payer: Self-pay | Admitting: Physician Assistant

## 2022-09-12 ENCOUNTER — Ambulatory Visit: Payer: PPO | Attending: Physician Assistant | Admitting: Physician Assistant

## 2022-09-12 VITALS — BP 128/82 | HR 78 | Ht 64.0 in | Wt 157.4 lb

## 2022-09-12 DIAGNOSIS — I2510 Atherosclerotic heart disease of native coronary artery without angina pectoris: Secondary | ICD-10-CM

## 2022-09-12 DIAGNOSIS — Z720 Tobacco use: Secondary | ICD-10-CM | POA: Diagnosis not present

## 2022-09-12 DIAGNOSIS — E782 Mixed hyperlipidemia: Secondary | ICD-10-CM | POA: Diagnosis not present

## 2022-09-12 DIAGNOSIS — R0989 Other specified symptoms and signs involving the circulatory and respiratory systems: Secondary | ICD-10-CM | POA: Diagnosis not present

## 2022-09-12 NOTE — Patient Instructions (Signed)
Medication Instructions:  Your physician recommends that you continue on your current medications as directed. Please refer to the Current Medication list given to you today.   Labwork: None today  Testing/Procedures: Your physician has requested that you have a carotid duplex. This test is an ultrasound of the carotid arteries in your neck. It looks at blood flow through these arteries that supply the brain with blood. Allow one hour for this exam. There are no restrictions or special instructions.   Follow-Up: 1 year Dr.Nishan  Any Other Special Instructions Will Be Listed Below (If Applicable).  If you need a refill on your cardiac medications before your next appointment, please call your pharmacy.

## 2022-09-14 DIAGNOSIS — R220 Localized swelling, mass and lump, head: Secondary | ICD-10-CM | POA: Diagnosis not present

## 2022-09-14 DIAGNOSIS — R519 Headache, unspecified: Secondary | ICD-10-CM | POA: Diagnosis not present

## 2022-09-14 DIAGNOSIS — H9319 Tinnitus, unspecified ear: Secondary | ICD-10-CM | POA: Diagnosis not present

## 2022-09-14 DIAGNOSIS — R221 Localized swelling, mass and lump, neck: Secondary | ICD-10-CM | POA: Diagnosis not present

## 2022-10-03 ENCOUNTER — Ambulatory Visit: Payer: PPO | Attending: Physician Assistant

## 2022-10-03 DIAGNOSIS — R0989 Other specified symptoms and signs involving the circulatory and respiratory systems: Secondary | ICD-10-CM

## 2022-10-04 ENCOUNTER — Ambulatory Visit: Payer: PPO | Admitting: Adult Health

## 2022-11-07 ENCOUNTER — Ambulatory Visit: Payer: PPO | Admitting: Adult Health

## 2023-01-01 DIAGNOSIS — F331 Major depressive disorder, recurrent, moderate: Secondary | ICD-10-CM | POA: Diagnosis not present

## 2023-02-07 DIAGNOSIS — R7301 Impaired fasting glucose: Secondary | ICD-10-CM | POA: Diagnosis not present

## 2023-02-07 DIAGNOSIS — E7801 Familial hypercholesterolemia: Secondary | ICD-10-CM | POA: Diagnosis not present

## 2023-02-07 DIAGNOSIS — E7849 Other hyperlipidemia: Secondary | ICD-10-CM | POA: Diagnosis not present

## 2023-02-14 DIAGNOSIS — J439 Emphysema, unspecified: Secondary | ICD-10-CM | POA: Diagnosis not present

## 2023-02-14 DIAGNOSIS — I700 Atherosclerosis of aorta: Secondary | ICD-10-CM | POA: Diagnosis not present

## 2023-02-14 DIAGNOSIS — R739 Hyperglycemia, unspecified: Secondary | ICD-10-CM | POA: Diagnosis not present

## 2023-02-14 DIAGNOSIS — Z87891 Personal history of nicotine dependence: Secondary | ICD-10-CM | POA: Diagnosis not present

## 2023-02-14 DIAGNOSIS — I2510 Atherosclerotic heart disease of native coronary artery without angina pectoris: Secondary | ICD-10-CM | POA: Diagnosis not present

## 2023-02-14 DIAGNOSIS — R931 Abnormal findings on diagnostic imaging of heart and coronary circulation: Secondary | ICD-10-CM | POA: Diagnosis not present

## 2023-02-14 DIAGNOSIS — H9313 Tinnitus, bilateral: Secondary | ICD-10-CM | POA: Diagnosis not present

## 2023-02-14 DIAGNOSIS — I2584 Coronary atherosclerosis due to calcified coronary lesion: Secondary | ICD-10-CM | POA: Diagnosis not present

## 2023-02-14 DIAGNOSIS — R7301 Impaired fasting glucose: Secondary | ICD-10-CM | POA: Diagnosis not present

## 2023-02-14 DIAGNOSIS — I10 Essential (primary) hypertension: Secondary | ICD-10-CM | POA: Diagnosis not present

## 2023-02-14 DIAGNOSIS — Z780 Asymptomatic menopausal state: Secondary | ICD-10-CM | POA: Diagnosis not present

## 2023-02-14 DIAGNOSIS — E7849 Other hyperlipidemia: Secondary | ICD-10-CM | POA: Diagnosis not present

## 2023-03-20 DIAGNOSIS — Z122 Encounter for screening for malignant neoplasm of respiratory organs: Secondary | ICD-10-CM | POA: Diagnosis not present

## 2023-03-20 DIAGNOSIS — Z87891 Personal history of nicotine dependence: Secondary | ICD-10-CM | POA: Diagnosis not present

## 2023-03-20 DIAGNOSIS — F17210 Nicotine dependence, cigarettes, uncomplicated: Secondary | ICD-10-CM | POA: Diagnosis not present

## 2023-03-22 DIAGNOSIS — F331 Major depressive disorder, recurrent, moderate: Secondary | ICD-10-CM | POA: Diagnosis not present

## 2023-04-10 ENCOUNTER — Other Ambulatory Visit (HOSPITAL_COMMUNITY): Payer: Self-pay | Admitting: General Practice

## 2023-04-10 DIAGNOSIS — Z1231 Encounter for screening mammogram for malignant neoplasm of breast: Secondary | ICD-10-CM

## 2023-04-12 DIAGNOSIS — Z6829 Body mass index (BMI) 29.0-29.9, adult: Secondary | ICD-10-CM | POA: Diagnosis not present

## 2023-04-12 DIAGNOSIS — N200 Calculus of kidney: Secondary | ICD-10-CM | POA: Diagnosis not present

## 2023-04-12 DIAGNOSIS — R030 Elevated blood-pressure reading, without diagnosis of hypertension: Secondary | ICD-10-CM | POA: Diagnosis not present

## 2023-04-12 DIAGNOSIS — F17210 Nicotine dependence, cigarettes, uncomplicated: Secondary | ICD-10-CM | POA: Diagnosis not present

## 2023-04-12 DIAGNOSIS — R319 Hematuria, unspecified: Secondary | ICD-10-CM | POA: Diagnosis not present

## 2023-04-26 DIAGNOSIS — L74511 Primary focal hyperhidrosis, face: Secondary | ICD-10-CM | POA: Diagnosis not present

## 2023-05-02 DIAGNOSIS — M25562 Pain in left knee: Secondary | ICD-10-CM | POA: Diagnosis not present

## 2023-05-04 DIAGNOSIS — K449 Diaphragmatic hernia without obstruction or gangrene: Secondary | ICD-10-CM | POA: Diagnosis not present

## 2023-05-04 DIAGNOSIS — R319 Hematuria, unspecified: Secondary | ICD-10-CM | POA: Diagnosis not present

## 2023-05-04 DIAGNOSIS — N200 Calculus of kidney: Secondary | ICD-10-CM | POA: Diagnosis not present

## 2023-05-21 ENCOUNTER — Ambulatory Visit (HOSPITAL_COMMUNITY)
Admission: RE | Admit: 2023-05-21 | Discharge: 2023-05-21 | Disposition: A | Discharge status: Home / Self Care | Payer: PPO | Source: Ambulatory Visit | Attending: General Practice | Admitting: General Practice

## 2023-05-21 DIAGNOSIS — Z1231 Encounter for screening mammogram for malignant neoplasm of breast: Secondary | ICD-10-CM | POA: Diagnosis not present

## 2023-07-06 DIAGNOSIS — J209 Acute bronchitis, unspecified: Secondary | ICD-10-CM | POA: Diagnosis not present

## 2023-07-06 DIAGNOSIS — R062 Wheezing: Secondary | ICD-10-CM | POA: Diagnosis not present

## 2023-07-06 DIAGNOSIS — J029 Acute pharyngitis, unspecified: Secondary | ICD-10-CM | POA: Diagnosis not present

## 2023-07-06 DIAGNOSIS — R051 Acute cough: Secondary | ICD-10-CM | POA: Diagnosis not present

## 2023-08-01 DIAGNOSIS — K121 Other forms of stomatitis: Secondary | ICD-10-CM | POA: Diagnosis not present

## 2023-08-01 DIAGNOSIS — Z6829 Body mass index (BMI) 29.0-29.9, adult: Secondary | ICD-10-CM | POA: Diagnosis not present

## 2023-08-01 DIAGNOSIS — E663 Overweight: Secondary | ICD-10-CM | POA: Diagnosis not present

## 2023-08-01 DIAGNOSIS — I10 Essential (primary) hypertension: Secondary | ICD-10-CM | POA: Diagnosis not present

## 2023-08-09 DIAGNOSIS — E7800 Pure hypercholesterolemia, unspecified: Secondary | ICD-10-CM | POA: Diagnosis not present

## 2023-08-09 DIAGNOSIS — I10 Essential (primary) hypertension: Secondary | ICD-10-CM | POA: Diagnosis not present

## 2023-08-09 DIAGNOSIS — R7301 Impaired fasting glucose: Secondary | ICD-10-CM | POA: Diagnosis not present

## 2023-08-09 DIAGNOSIS — E782 Mixed hyperlipidemia: Secondary | ICD-10-CM | POA: Diagnosis not present

## 2023-08-09 DIAGNOSIS — E7849 Other hyperlipidemia: Secondary | ICD-10-CM | POA: Diagnosis not present

## 2023-08-15 DIAGNOSIS — M79672 Pain in left foot: Secondary | ICD-10-CM | POA: Diagnosis not present

## 2023-08-15 DIAGNOSIS — J439 Emphysema, unspecified: Secondary | ICD-10-CM | POA: Diagnosis not present

## 2023-08-15 DIAGNOSIS — I700 Atherosclerosis of aorta: Secondary | ICD-10-CM | POA: Diagnosis not present

## 2023-08-15 DIAGNOSIS — I10 Essential (primary) hypertension: Secondary | ICD-10-CM | POA: Diagnosis not present

## 2023-09-03 DIAGNOSIS — F331 Major depressive disorder, recurrent, moderate: Secondary | ICD-10-CM | POA: Diagnosis not present

## 2023-10-01 NOTE — Progress Notes (Signed)
 Cardiology Office Note:  .   Date:  10/15/2023  ID:  Melanie Jimenez, DOB [birth year], MRN [id number] PCP: [facility]  [facility] Cardiologist:  Janelle Mediate, MD    History of Present Illness: .   Melanie Jimenez is a 63 y.o. female  with  history of  tobacco abuse, atypical chest pain 2018 normal NST.   Patient saw Dr. Stann Earnest 07/2021 with atypical chest pain. Coronary CTA calcium score 183 with mild non-obstructive CAD and mild atherosclerosis of proximal ascending aorta. Crestor and ASA added.    Patient has had a tough year-mother was sick and passed away, Husband had surgery and another surgery tomorrow. Currently has a tick bite and on Doxycycline-has appt with PCP later today. Denies chest pain,dyspnea, palpitations, edema. Has knee problems & tinnitus so no regular exercise but stays busy working in the yard, puppies etc.     ROS:    Studies Reviewed: Aaron Aas    EKG Interpretation Date/Time:  Monday October 15 2023 11:19:41 EDT Ventricular Rate:  74 PR Interval:  172 QRS Duration:  88 QT Interval:  390 QTC Calculation: 432 R Axis:   -17  Text Interpretation: Normal sinus rhythm Septal infarct , age undetermined When compared with ECG of 09/12/22 unchanged PREVIOUS ECG IS PRESENT Confirmed by Theotis Flake [phone]) on 10/15/2023 11:23:30 AM    Prior CV Studies:   Coronary CTA 07/2021 Coronary Calcium Score:   Left main: 0   Left anterior descending artery: 39.3   Left circumflex artery: 60.3   Right coronary artery: 83.1   Total: 183   Percentile: 96   Other findings:   Normal pulmonary vein drainage into the left atrium.   Normal left atrial appendage without a thrombus.   Normal size of the pulmonary artery.   IMPRESSION: 1. Coronary calcium score of 183. This was 69 percentile for age and sex matched control.   2. Normal coronary origin with right dominance.   3. CAD-RADS 2. Mild non-obstructive CAD (25-49%).  Consider non-atherosclerotic causes of chest pain. Consider preventive therapy and risk factor modification.   4. Mild atherosclerosis of the proximal ascending aorta.     Electronically Signed   By: Kardie  Tobb D.O.   On: 07/20/2021 16:21  Carotid dopplers 10/2022 Summary:  Right Carotid: Velocities in the right ICA are consistent with a 1-39%  stenosis.   Left Carotid: Velocities in the left ICA are consistent with a 1-39%  stenosis.   Vertebrals: Bilateral vertebral arteries demonstrate antegrade flow.  Subclavians: Normal flow hemodynamics were seen in bilateral subclavian               arteries.   *See table(s) above for measurements and observations.      Electronically signed by Antionette Kirks MD on 10/05/2022 at 12:21:01 PM.        Final      Risk Assessment/Calculations:             Physical Exam:   VS:  BP 122/78   Pulse 74   Ht 5\' 3"  (1.6 m)   Wt 166 lb 9.6 oz (75.6 kg)   SpO2 97%   BMI 29.51 kg/m    Wt Readings from Last 3 Encounters:  10/15/23 166 lb 9.6 oz (75.6 kg)  09/12/22 157 lb 6.4 oz (71.4 kg)  07/18/21 153 lb 3.2 oz (69.5 kg)    GEN: Well nourished, well developed in no acute distress NECK: No JVD; right carotid bruit CARDIAC:  RRR, no murmurs, rubs, gallops RESPIRATORY:  Clear to auscultation without rales, wheezing or rhonchi  ABDOMEN: Soft, non-tender, non-distended EXTREMITIES:  No edema; No deformity   ASSESSMENT AND PLAN: .    Non-obstructive CAD on Coronary CTA 07/2021 on crestor and ASA-no angina. Labs done by PCP 09/2023-will try to get records.    Tobacco abuse-quit smoking 3 yrs ago!!   HLD on crestor PCP did labs last month-will try to get results   Right carotid bruit-check dopplers. Last year they were ok 1-39%. If ok this year can wait 2-3 yrs for recheck.           Dispo: f/u in 1 yr  Signed, Theotis Flake, PA-C

## 2023-10-09 ENCOUNTER — Ambulatory Visit: Payer: PPO | Admitting: Physician Assistant

## 2023-10-15 ENCOUNTER — Encounter: Payer: Self-pay | Admitting: Physician Assistant

## 2023-10-15 ENCOUNTER — Telehealth: Payer: Self-pay

## 2023-10-15 ENCOUNTER — Encounter: Payer: Self-pay | Admitting: General Practice

## 2023-10-15 ENCOUNTER — Ambulatory Visit: Payer: PPO | Attending: Physician Assistant | Admitting: Physician Assistant

## 2023-10-15 VITALS — BP 122/78 | HR 74 | Ht 63.0 in | Wt 166.6 lb

## 2023-10-15 DIAGNOSIS — I2510 Atherosclerotic heart disease of native coronary artery without angina pectoris: Secondary | ICD-10-CM

## 2023-10-15 DIAGNOSIS — Z720 Tobacco use: Secondary | ICD-10-CM

## 2023-10-15 DIAGNOSIS — R0989 Other specified symptoms and signs involving the circulatory and respiratory systems: Secondary | ICD-10-CM | POA: Diagnosis not present

## 2023-10-15 DIAGNOSIS — E782 Mixed hyperlipidemia: Secondary | ICD-10-CM

## 2023-10-15 MED ORDER — ROSUVASTATIN CALCIUM 20 MG PO TABS: 20.0000 mg | ORAL_TABLET | Freq: Every day | ORAL | 3 refills | Status: DC

## 2023-10-15 NOTE — Patient Instructions (Signed)
 Medication Instructions:  Your physician recommends that you continue on your current medications as directed. Please refer to the Current Medication list given to you today.  *If you need a refill on your cardiac medications before your next appointment, please call your pharmacy*  Lab Work: None If you have labs (blood work) drawn today and your tests are completely normal, you will receive your results only by: MyChart Message (if you have MyChart) OR A paper copy in the mail If you have any lab test that is abnormal or we need to change your treatment, we will call you to review the results.  Testing/Procedures: Your physician has requested that you have a carotid duplex. This test is an ultrasound of the carotid arteries in your neck. It looks at blood flow through these arteries that supply the brain with blood. Allow one hour for this exam. There are no restrictions or special instructions.   Follow-Up: At [facility], you and your health needs are our priority.  As part of our continuing mission to provide you with exceptional heart care, our providers are all part of one team.  This team includes your primary Cardiologist (physician) and Advanced Practice Providers or APPs (Physician Assistants and Nurse Practitioners) who all work together to provide you with the care you need, when you need it.  Your next appointment:   1 year(s)  Provider:   You may see Janelle Mediate, MD or one of the following Advanced Practice Providers on your designated Care Team:   Woodfin Hays, PA-C  [place], [place] Theotis Flake, [place]     We recommend signing up for the patient portal called "MyChart".  Sign up information is provided on this After Visit Summary.  MyChart is used to connect with patients for Virtual Visits (Telemedicine).  Patients are able to view lab/test results, encounter notes, upcoming appointments, etc.  Non-urgent messages can be sent to your provider as well.    To learn more about what you can do with MyChart, go to [email or web address].   Other Instructions

## 2023-10-15 NOTE — Telephone Encounter (Signed)
-----   Message from Theotis Flake sent at 10/15/2023  2:20 PM EDT ----- Thanks for sending labs. LDL above goal of less than 70. Increase crestor 20 mg daily and f/u FLP in 3 months thanks

## 2023-11-12 ENCOUNTER — Telehealth: Payer: Self-pay | Admitting: Cardiovascular Disease

## 2023-11-12 DIAGNOSIS — E782 Mixed hyperlipidemia: Secondary | ICD-10-CM

## 2023-11-12 MED ORDER — ROSUVASTATIN CALCIUM 10 MG PO TABS: 10.0000 mg | ORAL_TABLET | Freq: Every day | ORAL | 3 refills | Status: DC

## 2023-11-12 MED ORDER — EZETIMIBE 10 MG PO TABS: 10.0000 mg | ORAL_TABLET | Freq: Every day | ORAL | 3 refills | Status: AC

## 2023-11-12 NOTE — Telephone Encounter (Signed)
 Patient notified and verbalized understanding.

## 2023-11-12 NOTE — Telephone Encounter (Signed)
 Pt c/o medication issue:  1. Name of Medication: rosuvastatin  (CRESTOR ) 20 MG tablet   2. How are you currently taking this medication (dosage and times per day)? No  3. Are you having a reaction (difficulty breathing--STAT)? No  4. What is your medication issue? Pt states she's having muscle aches and she's not sure if its due the medication dosages increase. Please advise

## 2023-11-12 NOTE — Telephone Encounter (Signed)
 Medication dosage increased to 20 mg on 4/14. Pt stated that since she has been taking increased dosage, she has had muscle cramps in her legs and feet. Patient is questioning if medication could be causing this.    Please advise.

## 2023-11-23 DIAGNOSIS — R5382 Chronic fatigue, unspecified: Secondary | ICD-10-CM | POA: Diagnosis not present

## 2023-11-23 DIAGNOSIS — S30861A Insect bite (nonvenomous) of abdominal wall, initial encounter: Secondary | ICD-10-CM | POA: Diagnosis not present

## 2023-11-23 DIAGNOSIS — R1032 Left lower quadrant pain: Secondary | ICD-10-CM | POA: Diagnosis not present

## 2023-11-23 DIAGNOSIS — Z6829 Body mass index (BMI) 29.0-29.9, adult: Secondary | ICD-10-CM | POA: Diagnosis not present

## 2023-11-27 ENCOUNTER — Ambulatory Visit (HOSPITAL_COMMUNITY)
Admission: RE | Admit: 2023-11-27 | Discharge: 2023-11-27 | Disposition: A | Discharge status: Home / Self Care | Source: Ambulatory Visit | Attending: Physician Assistant | Admitting: Physician Assistant

## 2023-11-27 DIAGNOSIS — R0989 Other specified symptoms and signs involving the circulatory and respiratory systems: Secondary | ICD-10-CM | POA: Diagnosis not present

## 2023-11-27 DIAGNOSIS — R1032 Left lower quadrant pain: Secondary | ICD-10-CM | POA: Diagnosis not present

## 2023-11-28 ENCOUNTER — Ambulatory Visit: Payer: Self-pay | Admitting: Physician Assistant

## 2023-11-29 ENCOUNTER — Telehealth: Payer: Self-pay | Admitting: Adult Health

## 2023-11-29 NOTE — Telephone Encounter (Signed)
 Pt had had LLQ pain and had US  has small fibroid not of concern, could not see right ovary and left ovary looked normal per report. She has appt in July she says

## 2023-11-29 NOTE — Telephone Encounter (Signed)
 Called and left message that I received US  from [facility], call me back, to go over.

## 2023-11-30 ENCOUNTER — Telehealth: Payer: Self-pay | Admitting: Cardiovascular Disease

## 2023-11-30 DIAGNOSIS — K760 Fatty (change of) liver, not elsewhere classified: Secondary | ICD-10-CM | POA: Diagnosis not present

## 2023-11-30 DIAGNOSIS — K4090 Unilateral inguinal hernia, without obstruction or gangrene, not specified as recurrent: Secondary | ICD-10-CM | POA: Diagnosis not present

## 2023-11-30 DIAGNOSIS — N132 Hydronephrosis with renal and ureteral calculous obstruction: Secondary | ICD-10-CM | POA: Diagnosis not present

## 2023-11-30 DIAGNOSIS — N134 Hydroureter: Secondary | ICD-10-CM | POA: Diagnosis not present

## 2023-11-30 DIAGNOSIS — N200 Calculus of kidney: Secondary | ICD-10-CM | POA: Diagnosis not present

## 2023-11-30 DIAGNOSIS — K449 Diaphragmatic hernia without obstruction or gangrene: Secondary | ICD-10-CM | POA: Diagnosis not present

## 2023-11-30 NOTE — Telephone Encounter (Signed)
 Call returned to pt and results given.

## 2023-11-30 NOTE — Telephone Encounter (Signed)
 Pt called in asking for a call about results

## 2023-11-30 NOTE — Telephone Encounter (Signed)
 Patient is returning call. Please advise?

## 2023-12-03 DIAGNOSIS — R739 Hyperglycemia, unspecified: Secondary | ICD-10-CM | POA: Diagnosis not present

## 2023-12-03 DIAGNOSIS — N390 Urinary tract infection, site not specified: Secondary | ICD-10-CM | POA: Diagnosis not present

## 2023-12-03 DIAGNOSIS — R300 Dysuria: Secondary | ICD-10-CM | POA: Diagnosis not present

## 2023-12-03 DIAGNOSIS — R5383 Other fatigue: Secondary | ICD-10-CM | POA: Diagnosis not present

## 2023-12-03 DIAGNOSIS — R319 Hematuria, unspecified: Secondary | ICD-10-CM | POA: Diagnosis not present

## 2023-12-03 DIAGNOSIS — N200 Calculus of kidney: Secondary | ICD-10-CM | POA: Diagnosis not present

## 2023-12-11 ENCOUNTER — Ambulatory Visit: Admitting: Adult Health

## 2023-12-11 ENCOUNTER — Encounter: Payer: Self-pay | Admitting: Adult Health

## 2023-12-11 VITALS — BP 118/77 | HR 79 | Ht 63.0 in | Wt 166.5 lb

## 2023-12-11 DIAGNOSIS — R1032 Left lower quadrant pain: Secondary | ICD-10-CM | POA: Diagnosis not present

## 2023-12-11 DIAGNOSIS — Z01419 Encounter for gynecological examination (general) (routine) without abnormal findings: Secondary | ICD-10-CM

## 2023-12-11 DIAGNOSIS — Z1331 Encounter for screening for depression: Secondary | ICD-10-CM

## 2023-12-11 DIAGNOSIS — Z1211 Encounter for screening for malignant neoplasm of colon: Secondary | ICD-10-CM

## 2023-12-11 LAB — HEMOCCULT GUIAC POC 1CARD (OFFICE): Fecal Occult Blood, POC: NEGATIVE

## 2023-12-11 NOTE — Progress Notes (Signed)
 Patient ID: Melanie Jimenez, female   DOB: Nov 23, 1960, 63 y.o.   MRN: [id number] History of Present Illness: Melanie Jimenez is a 63 year old white female, married, PM in for a well woman gyn exam. She has had LLQ pain and had CT at [facility], has kidney stones, fatty liver and ?colitis. She had US  at [facility] and has small fibroid. She has to get rx for septra  ds and flagyl from PCP, treating for diverticulitis.   Has had tinnitus for years.    Component Value Date/Time   DIAGPAP  07/18/2021 1400    - Negative for intraepithelial lesion or malignancy (NILM)   DIAGPAP  07/17/2019 1503    - Negative for intraepithelial lesion or malignancy (NILM)   DIAGPAP  03/30/2017 0000    NEGATIVE FOR INTRAEPITHELIAL LESIONS OR MALIGNANCY.   HPVHIGH Negative 07/18/2021 1400   HPVHIGH Negative 07/17/2019 1503   ADEQPAP  07/18/2021 1400    Satisfactory for evaluation; transformation zone component PRESENT.   ADEQPAP  07/17/2019 1503    Satisfactory for evaluation; transformation zone component PRESENT.   ADEQPAP  03/30/2017 0000    Satisfactory for evaluation  endocervical/transformation zone component ABSENT.    PCP is Jerral Moos PA at [facility]   Current Medications, Allergies, Past Medical History, Past Surgical History, Family History and Social History were reviewed in [employer] record.     Review of Systems: Patient denies any headaches, hearing loss, fatigue, blurred vision, shortness of breath, chest pain,  problems with bowel movements, urination, or intercourse(not active). No joint pain or mood swings.  Denies any vaginal bleeding. Has nausea at times  See HPI for positives   Physical Exam:BP 118/77 (BP Location: Left Arm, Patient Position: Sitting, Cuff Size: Normal)   Pulse 79   Ht 5\' 3"  (1.6 m)   Wt 166 lb 8 oz (75.5 kg)   BMI 29.49 kg/m   General:  Well developed, well nourished, no acute distress Skin:  Warm and dry Neck:  Midline trachea, normal thyroid ,  good ROM, no lymphadenopathy, no carotid bruits heard Lungs; Clear to auscultation bilaterally Breast:  No dominant palpable mass, retraction, or nipple discharge Cardiovascular: Regular rate and rhythm Abdomen:  Soft, non tender, no hepatosplenomegaly Pelvic:  External genitalia is normal in appearance, no lesions.  The vagina is pale. Urethra has no lesions or masses. The cervix is smooth.  Uterus is felt to be normal size, shape, and contour.  No adnexal masses, LLQ tenderness noted over bowel area.Bladder is non tender, no masses felt. Rectal: Good sphincter tone, no polyps, or hemorrhoids felt.  Hemoccult negative. Extremities/musculoskeletal:  No swelling or varicosities noted, no clubbing or cyanosis Psych:  No mood changes, alert and cooperative,seems happy  I reviewed CT with her from [facility] AA is 0 Fall risk is low    12/11/2023    3:40 PM 07/18/2021    1:37 PM 05/12/2020    8:38 PM  Depression screen PHQ 2/9  Decreased Interest 3 3   Down, Depressed, Hopeless 1 3   PHQ - 2 Score 4 6   Altered sleeping 3 2   Tired, decreased energy 3 3   Change in appetite 2 2   Feeling bad or failure about yourself  0 1   Trouble concentrating 2 2   Moving slowly or fidgety/restless 2 1   Suicidal thoughts 0 0   PHQ-9 Score 16 17      Information is confidential and restricted. Go to Review  Flowsheets to unlock data.   Has paxil and ativan     12/11/2023    3:41 PM 07/18/2021    1:37 PM 05/12/2020    8:38 PM  GAD 7 : Generalized Anxiety Score  Nervous, Anxious, on Edge 3 3   Control/stop worrying 2 3   Worry too much - different things 2 3   Trouble relaxing 3 3   Restless 2 2   Easily annoyed or irritable 2 2   Afraid - awful might happen 2 3   Total GAD 7 Score 16 19      Information is confidential and restricted. Go to Review Flowsheets to unlock data.    Upstream - 12/11/23 1535       Pregnancy Intention Screening   Does the patient want to become pregnant in  the next year? N/A    Does the patient's partner want to become pregnant in the next year? N/A    Would the patient like to discuss contraceptive options today? N/A      Contraception Wrap Up   Current Method Vasectomy   PM   End Method Vasectomy   PM   Contraception Counseling Provided No               Examination chaperoned by Alphonso Aschoff LPN   Impression and plan: 1. Encounter for well woman exam with routine gynecological exam (Primary) Pap and physical in 1 year  Mammogram was negative 05/21/23 Colonoscopy per GI Discussed that small fibroid not of concern   2. Encounter for screening fecal occult blood testing Hemoccult was negative  - POCT occult blood stool  3. LLQ pain Take meds from PCP She is to see urologist

## 2023-12-25 DIAGNOSIS — R109 Unspecified abdominal pain: Secondary | ICD-10-CM | POA: Diagnosis not present

## 2023-12-25 DIAGNOSIS — Z6829 Body mass index (BMI) 29.0-29.9, adult: Secondary | ICD-10-CM | POA: Diagnosis not present

## 2024-01-06 NOTE — Progress Notes (Unsigned)
 GI Office Note    Referring Provider: Lari Elspeth BRAVO, MD Primary Care Physician:  Jolee Elsie RAMAN, PA  Primary Gastroenterologist: Carlin POUR. Cindie, DO   Chief Complaint   Chief Complaint  Patient presents with   New Patient (Initial Visit)    Pt referred for nausea and she is having left lower abd pain. ( Last night it was hurting). Pt states she had a CT done on 5/30 in Care Everywhere.     History of Present Illness   Melanie Jimenez is a 63 y.o. female presenting today at the request Dr. Lari for abdominal pain and nausea.   Work up has included:  CT A/P with contrast 11/2023 at [facility]: New mild right hydroureteronephrosis with caliber change of the distal right ureter, but no evidence of ureteral calculus. Given mild prominence of the bladder wall and mild enhancement of the proximal ureters, differential includes ascending urinary tract infection. Recommend correlation with urinalysis.  Suspected underdistention of the sigmoid colon, less likely mild colitis. No adjacent inflammatory stranding. Correlate clinically.  Bilateral nonobstructing nephrolithiasis.  Hepatic steatosis.   Labs 11/2023: White blood cell count 7.4, hemoglobin 13.1, hematocrit 38.5, MCV 92.1, platelets 213,000, sodium 140, potassium 4.1, BUN 19, creatinine 0.73, albumin 4.2, total bilirubin 0.3, alk phos 58, AST 22, ALT 31H, TSH 0.914  Was treated with augmentin and flagyl for possible diverticulitis. Also took an antibiotic for possible UTI.   Has seen gyn last month. Stool Hemoccult neg June 2025. Small uterine fibroid of no concern.  Today:  Started 7-8 weeks ago. Started hurting left mid to left lower abdomen and goes over to the side. When sits down feels lke has water  balloon in there. Constant. Last night was hurting, today feels the sensation of pressure but no pain. Grabbing sensation. Feels lot of bloating. First five weeks very nauseated but that part is better. Notes pain worse  with movement, housework. PCP thought maybe has strained muscle. Using heating pad. Not hurting as bad the last few days. BM never been regular. May have stool 1-2 times per week on a good. Week. Uses miralax as needed. Typically takes 3 days to work once she takes it. No weight loss. No melena, brbpr. Some intermittent heartburn, takes TUMS with relief. No dysphagia.   Sees urology for further evaluation of CT findings.    Colonoscopy 06/2016: -redundant left colon -external and internal hemorrhoids -repeat colonoscopy in 5-10 years due to simple adenoma removed in 04/2016  Medications   Current Outpatient Medications  Medication Sig Dispense Refill   alendronate (FOSAMAX) 70 MG tablet Take by mouth.     amLODipine (NORVASC) 5 MG tablet Take 5 mg by mouth daily.     aspirin EC 81 MG tablet Take 81 mg by mouth daily.     calcium  carbonate (OS-CAL - DOSED IN MG OF ELEMENTAL CALCIUM ) 1250 (500 Ca) MG tablet Take 1 tablet by mouth.     Eszopiclone 3 MG TABS Take 3 mg by mouth at bedtime.     ezetimibe  (ZETIA ) 10 MG tablet Take 1 tablet (10 mg total) by mouth daily. 90 tablet 3   LORazepam (ATIVAN) 1 MG tablet Take 2 mg by mouth 2 (two) times daily.     Multiple Vitamins-Minerals (BIOTIN PLUS/CALCIUM /VIT D3 PO) Take 500 mg by mouth daily.     Omega-3 Fatty Acids (FISH OIL) 1000 MG CAPS Take 1 capsule by mouth daily.     PARoxetine (PAXIL) 20 MG tablet Take  20 mg by mouth at bedtime.     rosuvastatin  (CRESTOR ) 10 MG tablet Take 1 tablet (10 mg total) by mouth daily. 90 tablet 3   metroNIDAZOLE (FLAGYL) 500 MG tablet Take 500 mg by mouth 2 (two) times daily. (Patient not taking: Reported on 01/07/2024)     sulfamethoxazole -trimethoprim  (BACTRIM  DS) 800-160 MG tablet Take 1 tablet by mouth 2 (two) times daily. (Patient not taking: Reported on 01/07/2024)     No current facility-administered medications for this visit.    Allergies   Allergies as of 01/07/2024 - Review Complete 01/07/2024   Allergen Reaction Noted   Bupropion Other (See Comments) 02/12/2000    Past Medical History   Past Medical History:  Diagnosis Date   Anxiety    Chronic kidney disease    kidney stones,blood in urine   Constipation - functional 01/20/2015   Degeneration of cartilage    left knee   Depression    GERD (gastroesophageal reflux disease)    Headache    Hot flashes 10/15/2013   Tinnitus 10/15/2013   Tinnitus of both ears     Past Surgical History   Past Surgical History:  Procedure Laterality Date   APPENDECTOMY     CESAREAN SECTION     COLONOSCOPY N/A 04/07/2016   Procedure: COLONOSCOPY;  Surgeon: Margo LITTIE Haddock, MD;  Location: [facility];  Service: Endoscopy;  Laterality: N/A;  9:30 Am   COLONOSCOPY N/A 06/09/2016   Procedure: COLONOSCOPY;  Surgeon: Margo LITTIE Haddock, MD;  Location: [facility];  Service: Endoscopy;  Laterality: N/A;  9:30 am   LAPAROSCOPIC APPENDECTOMY N/A 11/25/2014   Procedure: LAPAROSCOPIC APPENDECTOMY AND LAPAROSCOPIC EXCISION OF ABDOMINAL MASS;  Surgeon: Donnice Lima, MD;  Location: [facility] OR;  Service: General;  Laterality: N/A;   lasix surgery     POLYPECTOMY  04/07/2016   Procedure: POLYPECTOMY;  Surgeon: Margo LITTIE Haddock, MD;  Location: [facility];  Service: Endoscopy;;  colon   TONSILECTOMY, ADENOIDECTOMY, BILATERAL MYRINGOTOMY AND TUBES     TONSILLECTOMY      Past Family History   Family History  Problem Relation Age of Onset   Diabetes Maternal Grandmother    Diabetes Maternal Grandfather    Diabetes Father    Peripheral vascular disease Father    Cancer Mother        lung   Diabetes Mother    Hyperlipidemia Mother    Colon cancer Neg Hx     Past Social History   Social History   Socioeconomic History   Marital status: Married    Spouse name: Not on file   Number of children: 1   Years of education: 12   Highest education level: Not on file  Occupational History   Not on file  Tobacco Use   Smoking status: Former    Current  packs/day: 1.00    Average packs/day: 1 pack/day for 47.0 years (47.0 ttl pk-yrs)    Types: Cigarettes    Start date: 01/22/1977   Smokeless tobacco: Never  Vaping Use   Vaping status: Every Day  Substance and Sexual Activity   Alcohol  use: No    Alcohol /week: 0.0 standard drinks of alcohol    Drug use: No   Sexual activity: Not Currently    Birth control/protection: Post-menopausal, Surgical    Comment: vasectomy  Other Topics Concern   Not on file  Social History Narrative   Patient is married with one child.   Patient is right handed.   Patient has  hs education.   Patient drinks 2-3 cups daily.   Social Drivers of Corporate investment banker Strain: Low Risk  (12/11/2023)   Overall Financial Resource Strain (CARDIA)    Difficulty of Paying Living Expenses: Not hard at all  Food Insecurity: No Food Insecurity (12/11/2023)   Hunger Vital Sign    Worried About Running Out of Food in the Last Year: Never true    Ran Out of Food in the Last Year: Never true  Transportation Needs: No Transportation Needs (12/11/2023)   PRAPARE - Administrator, Civil Service (Medical): No    Lack of Transportation (Non-Medical): No  Physical Activity: Insufficiently Active (12/11/2023)   Exercise Vital Sign    Days of Exercise per Week: 2 days    Minutes of Exercise per Session: 10 min  Stress: Stress Concern Present (12/11/2023)   [employer] of Occupational Health - Occupational Stress Questionnaire    Feeling of Stress : Very much  Social Connections: Moderately Integrated (12/11/2023)   Social Connection and Isolation Panel    Frequency of Communication with Friends and Family: Three times a week    Frequency of Social Gatherings with Friends and Family: Once a week    Attends Religious Services: 1 to 4 times per year    Active Member of [employer] or Organizations: No    Attends Banker Meetings: Never    Marital Status: Married  Catering manager Violence: Not At  Risk (12/11/2023)   Humiliation, Afraid, Rape, and Kick questionnaire    Fear of Current or Ex-Partner: No    Emotionally Abused: No    Physically Abused: No    Sexually Abused: No    Review of Systems   General: Negative for anorexia, weight loss, fever, chills, fatigue, weakness. Eyes: Negative for vision changes.  ENT: Negative for hoarseness, difficulty swallowing , nasal congestion. CV: Negative for chest pain, angina, palpitations, dyspnea on exertion, peripheral edema.  Respiratory: Negative for dyspnea at rest, dyspnea on exertion, cough, sputum, wheezing.  GI: See history of present illness. GU:  Negative for dysuria, hematuria, urinary incontinence, urinary frequency, nocturnal urination. See hpi MS: Negative for joint pain, low back pain.  Derm: Negative for rash or itching.  Neuro: Negative for weakness, abnormal sensation, seizure, frequent headaches, memory loss,  confusion.  Psych: Negative for anxiety, depression, suicidal ideation, hallucinations.  Endo: Negative for unusual weight change.  Heme: Negative for bruising or bleeding. Allergy: Negative for rash or hives.  Physical Exam   BP 105/72   Pulse 85   Temp 98.6 F (37 C)   Ht 5' 3 (1.6 m)   Wt 166 lb 3.2 oz (75.4 kg)   BMI 29.44 kg/m    General: Well-nourished, well-developed in no acute distress.  Head: Normocephalic, atraumatic.   Eyes: Conjunctiva pink, no icterus. Mouth: Oropharyngeal mucosa moist and pink  Neck: Supple without thyromegaly, masses, or lymphadenopathy.  Lungs: Clear to auscultation bilaterally.  Heart: Regular rate and rhythm, no murmurs rubs or gallops.  Abdomen: Bowel sounds are normal,  nondistended, no hepatosplenomegaly or masses,  no abdominal bruits or hernia, no rebound or guarding.  Mild llq pain below level of umbilicus. No rebound. Worse with straight leg raises. Worse when laying down/sitting up. Rectal: not performed Extremities: No lower extremity edema. No  clubbing or deformities.  Neuro: Alert and oriented x 4 , grossly normal neurologically.  Skin: Warm and dry, no rash or jaundice.   Psych: Alert and cooperative, normal  mood and affect.  Labs  See hpi Imaging Studies   No results found.  Assessment/Plan:   LLQ pain: occurring for 7-8 weeks, CT with ?abnormal sigmoid colon vs underdistention. Symptoms may be musculoskeletal, related to diverticulitis, constipation, less likely malignancy. Doubt she has colitis without diarrhea. Needs colonoscopy for further evaluation. -discussed possibility that given smoldering pain after completion of antibiotics, we do need to consider possibility complicated diverticulitis. Will update CT before colonoscopy.  H/o adenomatous colon polyps: -due surveillance colonoscopy, arrange after CT complete  Constipation: -start Linzess 290mcg daily. Samples provided. Call for rx if helpful. Could use consider transitioning to miralax once to twice daily once she has had regulation of her bowels, if she does not want to continue Linzess chronically  Hepatic steatosis: -Seen on recent CT -FIB-4 of 1.11, excludes advanced fibrosis -Instructions for fatty liver: Recommend 1-2# weight loss per week until ideal body weight through exercise & diet. Low fat/cholesterol diet.   Avoid sweets, sodas, fruit juices, sweetened beverages like tea, etc. Gradually increase exercise from 15 min daily up to 1 hr per day 5 days/week. Limit alcohol  use.   Sonny RAMAN. Ezzard, MHS, PA-C [facility]

## 2024-01-07 ENCOUNTER — Encounter: Payer: Self-pay | Admitting: Gastroenterology

## 2024-01-07 ENCOUNTER — Ambulatory Visit: Admitting: Gastroenterology

## 2024-01-07 VITALS — BP 105/72 | HR 85 | Temp 98.6°F | Ht 63.0 in | Wt 166.2 lb

## 2024-01-07 DIAGNOSIS — R1032 Left lower quadrant pain: Secondary | ICD-10-CM | POA: Diagnosis not present

## 2024-01-07 DIAGNOSIS — Z860101 Personal history of adenomatous and serrated colon polyps: Secondary | ICD-10-CM | POA: Diagnosis not present

## 2024-01-07 DIAGNOSIS — K5900 Constipation, unspecified: Secondary | ICD-10-CM

## 2024-01-07 DIAGNOSIS — K760 Fatty (change of) liver, not elsewhere classified: Secondary | ICD-10-CM | POA: Diagnosis not present

## 2024-01-07 DIAGNOSIS — R933 Abnormal findings on diagnostic imaging of other parts of digestive tract: Secondary | ICD-10-CM

## 2024-01-07 NOTE — Patient Instructions (Signed)
 Start Linzess 290mcg once daily on empty stomach for constipation. Samples provided. It is not unusually to have watery stools initially, even several per day. If you are able to continue for 1-2 weeks, this washout period usually resolves. If you feel like you are going too frequently, you can take Linzess every other day or we can reduce dose.  You can use Linzess to jumpstart your bowel movements, then switch back over to miralax one capful once to twice daily to maintain soft regular stools.  I will be in touch after discussing your case with Dr. Cindie. You may need repeat CT scan prior to colonoscopy. We will be in touch.

## 2024-01-08 ENCOUNTER — Telehealth: Payer: Self-pay | Admitting: Gastroenterology

## 2024-01-08 DIAGNOSIS — R933 Abnormal findings on diagnostic imaging of other parts of digestive tract: Secondary | ICD-10-CM

## 2024-01-08 DIAGNOSIS — R1032 Left lower quadrant pain: Secondary | ICD-10-CM

## 2024-01-08 NOTE — Telephone Encounter (Signed)
 Please let pt know that I discussed her case with Dr. Cindie. He is advising for updated CT A/P with IV/oral contrast to rule out complicated diverticulitis due to persistent LLQ pain,  before proceeding with colonoscopy. If agreeable please arrange.

## 2024-01-08 NOTE — Telephone Encounter (Signed)
Pt was made aware and is ready to move forward with scheduling.  

## 2024-01-09 NOTE — Telephone Encounter (Signed)
 Called pt to see if she can go to [facility] as per [facility] unable to get patient in anytime soon. She said she would but depends date/time. I gave her CS # so she can schedule.

## 2024-01-09 NOTE — Telephone Encounter (Signed)
Pt scheduled for 7/13

## 2024-01-09 NOTE — Addendum Note (Signed)
 Addended by: JEANELL GRAEME RAMAN on: 01/09/2024 08:14 AM   Modules accepted: Orders

## 2024-01-13 ENCOUNTER — Ambulatory Visit (HOSPITAL_BASED_OUTPATIENT_CLINIC_OR_DEPARTMENT_OTHER)
Admission: RE | Admit: 2024-01-13 | Discharge: 2024-01-13 | Disposition: A | Discharge status: Home / Self Care | Source: Ambulatory Visit | Attending: Gastroenterology | Admitting: Gastroenterology

## 2024-01-13 DIAGNOSIS — R1032 Left lower quadrant pain: Secondary | ICD-10-CM | POA: Diagnosis not present

## 2024-01-13 DIAGNOSIS — N200 Calculus of kidney: Secondary | ICD-10-CM | POA: Diagnosis not present

## 2024-01-13 DIAGNOSIS — R933 Abnormal findings on diagnostic imaging of other parts of digestive tract: Secondary | ICD-10-CM | POA: Diagnosis not present

## 2024-01-13 DIAGNOSIS — K760 Fatty (change of) liver, not elsewhere classified: Secondary | ICD-10-CM | POA: Diagnosis not present

## 2024-01-13 LAB — POCT I-STAT CREATININE: Creatinine, Ser: 0.7 mg/dL (ref 0.44–1.00)

## 2024-01-13 MED ORDER — IOHEXOL 300 MG/ML  SOLN
100.0000 mL | Freq: Once | INTRAMUSCULAR | Status: AC | PRN
  Administered 2024-01-13: 100 mL via INTRAVENOUS

## 2024-01-14 ENCOUNTER — Encounter: Payer: Self-pay | Admitting: Urology

## 2024-01-14 ENCOUNTER — Ambulatory Visit: Admitting: Urology

## 2024-01-14 VITALS — BP 129/82 | HR 80

## 2024-01-14 DIAGNOSIS — N200 Calculus of kidney: Secondary | ICD-10-CM | POA: Insufficient documentation

## 2024-01-14 DIAGNOSIS — Z87891 Personal history of nicotine dependence: Secondary | ICD-10-CM | POA: Insufficient documentation

## 2024-01-14 DIAGNOSIS — R3129 Other microscopic hematuria: Secondary | ICD-10-CM | POA: Insufficient documentation

## 2024-01-14 LAB — MICROSCOPIC EXAMINATION
Bacteria, UA: NONE SEEN
WBC, UA: NONE SEEN /HPF (ref 0–5)

## 2024-01-14 LAB — URINALYSIS, ROUTINE W REFLEX MICROSCOPIC
Bilirubin, UA: NEGATIVE
Glucose, UA: NEGATIVE
Ketones, UA: NEGATIVE
Leukocytes,UA: NEGATIVE
Nitrite, UA: NEGATIVE
Protein,UA: NEGATIVE
Specific Gravity, UA: 1.015 (ref 1.005–1.030)
Urobilinogen, Ur: 0.2 mg/dL (ref 0.2–1.0)
pH, UA: 6 (ref 5.0–7.5)

## 2024-01-14 NOTE — Patient Instructions (Signed)

## 2024-01-14 NOTE — Progress Notes (Signed)
 Name: Melanie Jimenez DOB: 10/08/1960 MRN: [id number]  History of Present Illness: Melanie Jimenez is a 63 y.o. female who presents today as a new patient at [facility]. All available relevant medical records have been reviewed.  Relevant history: 1. Kidney stone(s).  Recent history:  > 11/30/2023:  - CT abdomen/pelvis w/ contrast at [facility] showed Bilateral nonobstructing nephrolithiasis measuring up to 0.4 cm in the upper pole of the left kidney. Left pelviectasis/external pelvis. Mild right hydroureteronephrosis with caliber change of the distal right ureter. No obstructing calculus is visualized. Mild enhancement of the proximal ureters bilaterally.  > 01/13/2024:  - Normal creatinine (0.70). - CT abdomen/pelvis w/ contrast showed No suspicious masses identified. Multiple tiny less than 5 mm renal calculi are again seen bilaterally. No evidence of ureteral calculi or hydronephrosis. Unremarkable unopacified urinary bladder.  Today: She reports intermittent LLQ pain for the past 6-7 weeks which has been gradually improving; worse with activity and certain body positions. She states that her PCP told her it may be muscle-related issue. She reports that applying heat has helped.   She denies urinary urgency, frequency, dysuria, gross hematuria, hesitancy, straining to void, or sensations of incomplete emptying.  Denies prior workup for persistent microscopic hematuria.  She denies prior history of gross hematuria.  She denies history of GU malignancy or pelvic radiation.  She denies history of autoimmune disease. She reports history of smoking (1 ppd x47 years; quit 3 years ago). She reports taking anticoagulants (Aspirin 81 mg).   Medications: Current Outpatient Medications  Medication Sig Dispense Refill   amLODipine (NORVASC) 5 MG tablet Take 5 mg by mouth daily.     aspirin EC 81 MG tablet Take 81 mg by mouth daily.     calcium  carbonate (OS-CAL - DOSED IN MG OF  ELEMENTAL CALCIUM ) 1250 (500 Ca) MG tablet Take 1 tablet by mouth.     Eszopiclone 3 MG TABS Take 3 mg by mouth at bedtime.     ezetimibe  (ZETIA ) 10 MG tablet Take 1 tablet (10 mg total) by mouth daily. 90 tablet 3   LORazepam (ATIVAN) 1 MG tablet Take 2 mg by mouth 2 (two) times daily. (Patient taking differently: Take 2 mg by mouth 2 (two) times daily. prn)     Multiple Vitamins-Minerals (BIOTIN PLUS/CALCIUM /VIT D3 PO) Take 500 mg by mouth daily.     Omega-3 Fatty Acids (FISH OIL) 1000 MG CAPS Take 1 capsule by mouth daily.     PARoxetine (PAXIL) 20 MG tablet Take 20 mg by mouth at bedtime.     rosuvastatin  (CRESTOR ) 10 MG tablet Take 1 tablet (10 mg total) by mouth daily. 90 tablet 3   alendronate (FOSAMAX) 70 MG tablet Take by mouth. (Patient not taking: Reported on 01/14/2024)     No current facility-administered medications for this visit.    Allergies: Allergies  Allergen Reactions   Bupropion Other (See Comments)    Wellbutrin-Seizures and passed out  Other Reaction(s): decreased blood pressure    Past Medical History:  Diagnosis Date   Anxiety    Chronic kidney disease    kidney stones,blood in urine   Constipation - functional 01/20/2015   Degeneration of cartilage    left knee   Depression    GERD (gastroesophageal reflux disease)    Headache    Hot flashes 10/15/2013   Tinnitus 10/15/2013   Tinnitus of both ears    Past Surgical History:  Procedure Laterality Date   APPENDECTOMY  CESAREAN SECTION     COLONOSCOPY N/A 04/07/2016   Procedure: COLONOSCOPY;  Surgeon: Margo LITTIE Haddock, MD;  Location: [facility];  Service: Endoscopy;  Laterality: N/A;  9:30 Am   COLONOSCOPY N/A 06/09/2016   Procedure: COLONOSCOPY;  Surgeon: Margo LITTIE Haddock, MD;  Location: [facility];  Service: Endoscopy;  Laterality: N/A;  9:30 am   LAPAROSCOPIC APPENDECTOMY N/A 11/25/2014   Procedure: LAPAROSCOPIC APPENDECTOMY AND LAPAROSCOPIC EXCISION OF ABDOMINAL MASS;  Surgeon: Donnice Lima, MD;   Location: [facility] OR;  Service: General;  Laterality: N/A;   lasix surgery     POLYPECTOMY  04/07/2016   Procedure: POLYPECTOMY;  Surgeon: Margo LITTIE Haddock, MD;  Location: [facility];  Service: Endoscopy;;  colon   TONSILECTOMY, ADENOIDECTOMY, BILATERAL MYRINGOTOMY AND TUBES     TONSILLECTOMY     Family History  Problem Relation Age of Onset   Diabetes Maternal Grandmother    Diabetes Maternal Grandfather    Diabetes Father    Peripheral vascular disease Father    Cancer Mother        lung   Diabetes Mother    Hyperlipidemia Mother    Colon cancer Neg Hx    Social History   Socioeconomic History   Marital status: Married    Spouse name: Not on file   Number of children: 1   Years of education: 12   Highest education level: Not on file  Occupational History   Not on file  Tobacco Use   Smoking status: Former    Current packs/day: 1.00    Average packs/day: 1 pack/day for 47.0 years (47.0 ttl pk-yrs)    Types: Cigarettes    Start date: 01/22/1977   Smokeless tobacco: Never  Vaping Use   Vaping status: Every Day  Substance and Sexual Activity   Alcohol  use: No    Alcohol /week: 0.0 standard drinks of alcohol    Drug use: No   Sexual activity: Not Currently    Birth control/protection: Post-menopausal, Surgical    Comment: vasectomy  Other Topics Concern   Not on file  Social History Narrative   Patient is married with one child.   Patient is right handed.   Patient has hs education.   Patient drinks 2-3 cups daily.   Social Drivers of Corporate investment banker Strain: Low Risk  (12/11/2023)   Overall Financial Resource Strain (CARDIA)    Difficulty of Paying Living Expenses: Not hard at all  Food Insecurity: No Food Insecurity (12/11/2023)   Hunger Vital Sign    Worried About Running Out of Food in the Last Year: Never true    Ran Out of Food in the Last Year: Never true  Transportation Needs: No Transportation Needs (12/11/2023)   PRAPARE - Doctor, general practice (Medical): No    Lack of Transportation (Non-Medical): No  Physical Activity: Insufficiently Active (12/11/2023)   Exercise Vital Sign    Days of Exercise per Week: 2 days    Minutes of Exercise per Session: 10 min  Stress: Stress Concern Present (12/11/2023)   [employer] of Occupational Health - Occupational Stress Questionnaire    Feeling of Stress : Very much  Social Connections: Moderately Integrated (12/11/2023)   Social Connection and Isolation Panel    Frequency of Communication with Friends and Family: Three times a week    Frequency of Social Gatherings with Friends and Family: Once a week    Attends Religious Services: 1 to 4 times per year  Active Member of Clubs or Organizations: No    Attends Banker Meetings: Never    Marital Status: Married  Catering manager Violence: Not At Risk (12/11/2023)   Humiliation, Afraid, Rape, and Kick questionnaire    Fear of Current or Ex-Partner: No    Emotionally Abused: No    Physically Abused: No    Sexually Abused: No    SUBJECTIVE  Review of Systems Constitutional: Patient denies any unintentional weight loss or change in strength lntegumentary: Patient denies any rashes or pruritus Cardiovascular: Patient denies chest pain or syncope Respiratory: Patient denies shortness of breath Gastrointestinal: Patient denies nausea, vomiting, constipation, or diarrhea  Musculoskeletal: Patient denies muscle cramps or weakness Neurologic: Patient denies convulsions or seizures Allergic/Immunologic: Patient denies recent allergic reaction(s) Hematologic/Lymphatic: Patient denies bleeding tendencies Endocrine: Patient denies heat/cold intolerance  GU: As per HPI.  OBJECTIVE Vitals:   01/14/24 1108  BP: 129/82  Pulse: 80   There is no height or weight on file to calculate BMI.  Physical Examination Constitutional: No obvious distress; patient is non-toxic appearing  Cardiovascular: No visible  lower extremity edema.  Respiratory: The patient does not have audible wheezing/stridor; respirations do not appear labored  Gastrointestinal: Abdomen non-distended Musculoskeletal: Normal ROM of UEs  Skin: No obvious rashes/open sores  Neurologic: CN 2-12 grossly intact Psychiatric: Answered questions appropriately with normal affect  Hematologic/Lymphatic/Immunologic: No obvious bruises or sites of spontaneous bleeding  Urine microscopy: 3-10 RBC/hpf, otherwise unremarkable  ASSESSMENT Kidney stones - Plan: Urinalysis, Routine w reflex microscopic, DG Abd 1 View  Persistent microscopic hematuria  Former smoker  We reviewed recent history and imaging / lab results. Yesterday's CT showed interval resolution of mild right hydroureteronephrosis which was seen on the prior CT done 11/30/2023; no acute findings.   For small bilateral kidney stones advised 6 month f/u with KUB for surveillance (or sooner if needed). Advised adequate hydration and we discussed option to consider low oxalate diet given that calcium  oxalate is the most common type of stone. Handout provided about stone prevention diet.   For persistent microscopic hematuria we considered possible etiologies including but not limited to: vigorous exercise, sexual activity, stone, trauma, blood thinner use, urinary tract infection, urethral irritation secondary to vaginal atrophy, chronic kidney disease, glomerulonephropathy, malignancy. We discussed pt's nicotine use as a risk factor for GU cancer and encouraged continued cessation. Based on history and individual risk factors, advised cystoscopy for further evaluation as per the AUA/SUFU 2025 Microhematuria guideline.   Pt verbalized understanding and agreement. All questions were answered.  PLAN Advised the following: 1. Return for 1st available cystoscopy with any urology MD. 2. KUB and f/u with NP in 6 months for stones.  Orders Placed This Encounter  Procedures   DG Abd 1  View    Standing Status:   Future    Expected Date:   07/16/2024    Expiration Date:   01/13/2025    Reason for Exam (SYMPTOM  OR DIAGNOSIS REQUIRED):   kidney stone    Is patient pregnant?:   No    Preferred imaging location?:   [facility]   Urinalysis, Routine w reflex microscopic    It has been explained that the patient is to follow regularly with their PCP in addition to all other providers involved in their care and to follow instructions provided by these respective offices. Patient advised to contact urology clinic if any urologic-pertaining questions, concerns, new symptoms or problems arise in the interim period.  Patient  Instructions  >80% of stones are calcium  oxalate. This type of stones forms when body either isn't clearing oxalate well enough, is making too much oxalate, or too little citrate. This results in oxalate binding to form crystals, which continue to aggregate and form stones.  Limiting calcium  does not help, but limiting oxalate in the diet can help. Increasing citric acid intake may also help.  The following measures may help to prevent the recurrence of stones: Increase water  intake to 2-2.5 liters per day May add citrus juice (lemon, lime or orange juice) to water  Moderation in dairy foods Decrease in salt content 5. Low Oxalate diet: Oxylates are found in foods like Tomato, Spinach, red wine and chocolate (see additional resources below).  Internet resources for information regarding low oxalate diet: https://kidneystones.yangchunwu.com https://my.VerticalStretch.be  Foods Low in Sodium or Oxalate Foods You Can Eat  Drinks Coffee, fruit and veggie juice (using the recommended veggies), fruit punch  Fruits Apples, apricots (fresh or canned), avocado, bananas, cherries (sweet), cranberries, grapefruit, red or green grapes, lemon and lime juice, melons,  nectarines, papayas, peaches, pears, pineapples, oranges, strawberries (fresh), tangerines  Veggies Artichokes, asparagus, bamboo shoots, broccoli, brussels sprouts, cabbage, cauliflower, chayote squash, chicory, corn, cucumbers, endive, lettuce, lima beans, mushrooms, onions, peas, peppers, potatoes, radishes, rutabagas, zucchini  Breads, Cereals, Grains Egg noodles, rye bread, cooked and dry cereals without nuts or bran, crackers with unsalted tops, white or wild rice  Meat, Meat Replacements, Fish, Recruitment consultant, fish, poultry, eggs, egg whites, egg replacements  Soup Homemade soup (using the recommended veggies and meat), low-sodium bouillon, low-sodium canned  Desserts Cookies, cakes, ice cream, pudding without chocolate or nuts, candy without chocolate or nuts  Fats and Oils Butter, margarine, cream, oil, salad dressing, mayo  Other Foods Unsalted potato chips or pretzels, herbs (like garlic, garlic powder, onion powder), lemon juice, salt-free seasoning blends, vinegar  Other Foods Low in Oxalate Foods You Can Eat  Drinks Beer, cola, wine, buttermilk, lemonade or limeade (without added vitamin C), milk  Meat, Meat Replacements, Fish, [employer] meat, ham, bacon, hot dogs, bratwurst, sausage, chicken nuggets, cheddar cheese, canned fish and shellfish  Soup Tomato soup, cheese soup  Other Foods Coconuts, lemon or lime juices, sugar or sweeteners, jellies or jams (from the recommended list)   Moderate-Oxalate Foods Foods to Limit   Drinks Fruit and veggie juices (from the list below), chocolate milk, rice milk, hot cocoa, tea   Fruits Blackberries, blueberries, black currants, cherries (sour), fruit cocktail, mangoes, orange peel, prunes, purple plums   Veggies Baked beans, carrots, celery, green beans, parsnips, summer squash, tomatoes, turnips   Breads, Cereals, Grains White bread, cornbread or cornmeal, white English muffins, saltine or soda crackers, brown rice, vanilla wafers,  spaghetti and other noodles, firm tofu, bagels, oatmeal   Meat/meat replacements, fish, poultry Sardines   Desserts Chocolate cake   Fats and Oils Macadamia nuts, pistachio nuts, English walnuts   Other Foods Jams or jellies (made with the fruits above), pepper    High-Oxalate Foods Foods to Avoid Drinks Chocolate drink mixes, soy milk, Ovaltine, instant iced tea, fruit juices of fruits listed below Fruits Apricots (dried), red currants, figs, kiwi, plums, rhubarb Veggies Beans (wax, dried), beets and beet greens, chives, collard greens, eggplant, escarole, dark greens of all kinds, leeks, okra, parsley, rutabagas, spinach, Swiss chard, tomato paste, watercress Breads, Cereals, Grains Amaranth, barley, white corn flour, fried potatoes, fruitcake, grits, soybean products, sweet potatoes, wheat germ and bran, buckwheat flour, All Bran cereal, graham crackers,  pretzels, whole wheat bread Meat/meat replacements, fish, poultry  Dried beans, peanut butter, soy burgers, miso  Desserts Carob, chocolate, marmalades Fats and Oils Nuts (peanuts, almonds, pecans, cashews, hazelnuts), nut butters, sesame seeds, tahini paste Other Foods Poppy seeds   Electronically signed by:  Lauraine KYM Oz, MSN, FNP-C, CUNP 01/14/2024 11:27 AM

## 2024-01-16 ENCOUNTER — Telehealth: Payer: Self-pay | Admitting: *Deleted

## 2024-01-16 ENCOUNTER — Ambulatory Visit: Payer: Self-pay | Admitting: Gastroenterology

## 2024-01-16 MED ORDER — LINACLOTIDE 290 MCG PO CAPS: 290.0000 ug | ORAL_CAPSULE | Freq: Every day | ORAL | 5 refills | Status: DC

## 2024-01-16 NOTE — Telephone Encounter (Signed)
 Pt called and stated that Linzess  is working well for her and would like a prescription sent to the pharmacy.

## 2024-01-16 NOTE — Telephone Encounter (Signed)
 Rx sent. See CT result note as well.

## 2024-01-16 NOTE — Addendum Note (Signed)
 Addended by: EZZARD SONNY RAMAN on: 01/16/2024 09:31 PM   Modules accepted: Orders

## 2024-01-18 ENCOUNTER — Other Ambulatory Visit: Payer: Self-pay | Admitting: *Deleted

## 2024-01-18 ENCOUNTER — Encounter: Payer: Self-pay | Admitting: *Deleted

## 2024-01-18 MED ORDER — PEG 3350-KCL-NA BICARB-NACL 420 G PO SOLR: 4000.0000 mL | Freq: Once | ORAL | 0 refills | Status: AC

## 2024-01-30 ENCOUNTER — Ambulatory Visit: Admitting: Adult Health

## 2024-01-31 DIAGNOSIS — F331 Major depressive disorder, recurrent, moderate: Secondary | ICD-10-CM | POA: Diagnosis not present

## 2024-02-05 DIAGNOSIS — E782 Mixed hyperlipidemia: Secondary | ICD-10-CM | POA: Diagnosis not present

## 2024-02-05 DIAGNOSIS — F331 Major depressive disorder, recurrent, moderate: Secondary | ICD-10-CM | POA: Diagnosis not present

## 2024-02-05 DIAGNOSIS — Z1329 Encounter for screening for other suspected endocrine disorder: Secondary | ICD-10-CM | POA: Diagnosis not present

## 2024-02-05 DIAGNOSIS — R7301 Impaired fasting glucose: Secondary | ICD-10-CM | POA: Diagnosis not present

## 2024-02-05 DIAGNOSIS — R739 Hyperglycemia, unspecified: Secondary | ICD-10-CM | POA: Diagnosis not present

## 2024-02-05 DIAGNOSIS — I10 Essential (primary) hypertension: Secondary | ICD-10-CM | POA: Diagnosis not present

## 2024-02-12 DIAGNOSIS — R109 Unspecified abdominal pain: Secondary | ICD-10-CM | POA: Diagnosis not present

## 2024-02-12 DIAGNOSIS — Z6829 Body mass index (BMI) 29.0-29.9, adult: Secondary | ICD-10-CM | POA: Diagnosis not present

## 2024-02-12 DIAGNOSIS — I10 Essential (primary) hypertension: Secondary | ICD-10-CM | POA: Diagnosis not present

## 2024-02-12 DIAGNOSIS — J439 Emphysema, unspecified: Secondary | ICD-10-CM | POA: Diagnosis not present

## 2024-02-12 DIAGNOSIS — I700 Atherosclerosis of aorta: Secondary | ICD-10-CM | POA: Diagnosis not present

## 2024-02-19 ENCOUNTER — Encounter (HOSPITAL_COMMUNITY): Payer: Self-pay | Admitting: Internal Medicine

## 2024-02-19 ENCOUNTER — Encounter (HOSPITAL_COMMUNITY)
Admission: RE | Disposition: A | Discharge status: Home / Self Care | Payer: Self-pay | Source: Home / Self Care | Attending: Internal Medicine

## 2024-02-19 ENCOUNTER — Ambulatory Visit (HOSPITAL_COMMUNITY): Admitting: Anesthesiology

## 2024-02-19 ENCOUNTER — Other Ambulatory Visit: Payer: Self-pay

## 2024-02-19 ENCOUNTER — Ambulatory Visit (HOSPITAL_COMMUNITY)
Admission: RE | Admit: 2024-02-19 | Discharge: 2024-02-19 | Disposition: A | Discharge status: Home / Self Care | Attending: Internal Medicine | Admitting: Internal Medicine

## 2024-02-19 ENCOUNTER — Ambulatory Visit (HOSPITAL_BASED_OUTPATIENT_CLINIC_OR_DEPARTMENT_OTHER): Admitting: Anesthesiology

## 2024-02-19 DIAGNOSIS — K648 Other hemorrhoids: Secondary | ICD-10-CM

## 2024-02-19 DIAGNOSIS — Z860100 Personal history of colon polyps, unspecified: Secondary | ICD-10-CM | POA: Diagnosis not present

## 2024-02-19 DIAGNOSIS — Z09 Encounter for follow-up examination after completed treatment for conditions other than malignant neoplasm: Secondary | ICD-10-CM | POA: Diagnosis not present

## 2024-02-19 DIAGNOSIS — N189 Chronic kidney disease, unspecified: Secondary | ICD-10-CM | POA: Insufficient documentation

## 2024-02-19 DIAGNOSIS — I129 Hypertensive chronic kidney disease with stage 1 through stage 4 chronic kidney disease, or unspecified chronic kidney disease: Secondary | ICD-10-CM | POA: Insufficient documentation

## 2024-02-19 DIAGNOSIS — Z87891 Personal history of nicotine dependence: Secondary | ICD-10-CM | POA: Diagnosis not present

## 2024-02-19 DIAGNOSIS — Z860101 Personal history of adenomatous and serrated colon polyps: Secondary | ICD-10-CM

## 2024-02-19 DIAGNOSIS — F32A Depression, unspecified: Secondary | ICD-10-CM | POA: Insufficient documentation

## 2024-02-19 DIAGNOSIS — Z1211 Encounter for screening for malignant neoplasm of colon: Secondary | ICD-10-CM

## 2024-02-19 DIAGNOSIS — K219 Gastro-esophageal reflux disease without esophagitis: Secondary | ICD-10-CM | POA: Diagnosis not present

## 2024-02-19 DIAGNOSIS — F419 Anxiety disorder, unspecified: Secondary | ICD-10-CM | POA: Insufficient documentation

## 2024-02-19 HISTORY — PX: COLONOSCOPY: SHX5424

## 2024-02-19 SURGERY — COLONOSCOPY
Anesthesia: General

## 2024-02-19 MED ORDER — LACTATED RINGERS IV SOLN: INTRAVENOUS | Status: DC

## 2024-02-19 MED ORDER — GLYCOPYRROLATE PF 0.2 MG/ML IJ SOSY
PREFILLED_SYRINGE | INTRAMUSCULAR | Status: DC | PRN
  Administered 2024-02-19: .2 mg via INTRAVENOUS

## 2024-02-19 MED ORDER — LIDOCAINE 2% (20 MG/ML) 5 ML SYRINGE
INTRAMUSCULAR | Status: DC | PRN
  Administered 2024-02-19: 60 mg via INTRAVENOUS

## 2024-02-19 MED ORDER — PROPOFOL 500 MG/50ML IV EMUL
INTRAVENOUS | Status: DC | PRN
  Administered 2024-02-19: 150 ug/kg/min via INTRAVENOUS
  Administered 2024-02-19: 100 mg via INTRAVENOUS

## 2024-02-19 NOTE — Anesthesia Postprocedure Evaluation (Signed)
 Anesthesia Post Note  Patient: Melanie Jimenez  Procedure(s) Performed: COLONOSCOPY  Patient location during evaluation: Phase II Anesthesia Type: General Level of consciousness: awake Pain management: pain level controlled Vital Signs Assessment: post-procedure vital signs reviewed and stable Respiratory status: spontaneous breathing and respiratory function stable Cardiovascular status: blood pressure returned to baseline and stable Postop Assessment: no headache and no apparent nausea or vomiting Anesthetic complications: no Comments: Late entry   No notable events documented.   Last Vitals:  Vitals:   02/19/24 1117 02/19/24 1153  BP: (!) 143/84 103/63  Pulse: 77 67  Resp: 12 15  Temp: 36.9 C 36.7 C  SpO2: 97% 95%    Last Pain:  Vitals:   02/19/24 1158  TempSrc:   PainSc: 0-No pain                 Yvonna JINNY Bosworth

## 2024-02-19 NOTE — Anesthesia Procedure Notes (Signed)
 Date/Time: 02/19/2024 11:35 AM  Performed by: Para Jerelene CROME, CRNAOxygen Delivery Method: Nasal cannula

## 2024-02-19 NOTE — Op Note (Addendum)
 [facility] Patient Name: Melanie Jimenez Procedure Date: 02/19/2024 11:18 AM MRN: [id number] Date of Birth: [birth year] Attending MD: Carlin POUR. Cindie , [place], [id number] CSN: [id number] Age: 63 Admit Type: Outpatient Procedure:                Colonoscopy Indications:              Surveillance: Personal history of colonic polyps                            (unknown histology) on last colonoscopy more than 5                            years ago Providers:                Carlin POUR. Cindie, DO, Ashley Goins, [place]com Blush Referring MD:              Medicines:                See the Anesthesia note for documentation of the                            administered medications Complications:            No immediate complications. Estimated Blood Loss:     Estimated blood loss: none. Procedure:                Pre-Anesthesia Assessment:                           - The anesthesia plan was to use monitored                            anesthesia care (MAC).                           After obtaining informed consent, the colonoscope                            was passed under direct vision. Throughout the                            procedure, the patient's blood pressure, pulse, and                            oxygen saturations were monitored continuously. The                            [id number]-HQ190L ([id number]) Peds Colon was introduced                            through the anus and advanced to the the cecum,                            identified by appendiceal orifice and ileocecal                            valve. The colonoscopy was  performed without                            difficulty. The patient tolerated the procedure                            well. The quality of the bowel preparation was                            evaluated using the BBPS [facility] Bowel Preparation                            Scale) with scores of: Right Colon = 3, Transverse                            Colon = 3 and Left Colon  = 3 (entire mucosa seen                            well with no residual staining, small fragments of                            stool or opaque liquid). The total BBPS score                            equals 9. Scope In: 11:38:23 AM Scope Out: 11:49:49 AM Scope Withdrawal Time: 0 hours 6 minutes 59 seconds  Total Procedure Duration: 0 hours 11 minutes 26 seconds  Findings:      Non-bleeding internal hemorrhoids were found.      The exam was otherwise without abnormality. Impression:               - Non-bleeding internal hemorrhoids.                           - The examination was otherwise normal.                           - No specimens collected. Moderate Sedation:      Per Anesthesia Care Recommendation:           - Patient has a contact number available for                            emergencies. The signs and symptoms of potential                            delayed complications were discussed with the                            patient. Return to normal activities tomorrow.                            Written discharge instructions were provided to the                            patient.                           -  Resume previous diet.                           - Continue present medications.                           - Repeat colonoscopy in 10 years for screening                            purposes.                           - Return to GI clinic in 3 months.                           - Abdominal pain likely due to IBS C, patients                            reports steady improvement in this regard on                            Linzess , will continue. Procedure Code(s):        --- Professional ---                           H9894, Colorectal cancer screening; colonoscopy on                            individual at high risk Diagnosis Code(s):        --- Professional ---                           Z86.010, Personal history of colonic polyps                           K64.8,  Other hemorrhoids CPT copyright 2022 American Medical Association. All rights reserved. The codes documented in this report are preliminary and upon coder review may  be revised to meet current compliance requirements. Carlin POUR. Cindie, DO Carlin POUR. Cindie, DO 02/19/2024 11:52:36 AM This report has been signed electronically. Number of Addenda: 0

## 2024-02-19 NOTE — Discharge Instructions (Addendum)
  Colonoscopy Discharge Instructions  Read the instructions outlined below and refer to this sheet in the next few weeks. These discharge instructions provide you with general information on caring for yourself after you leave the hospital. Your doctor may also give you specific instructions. While your treatment has been planned according to the most current medical practices available, unavoidable complications occasionally occur.   ACTIVITY You may resume your regular activity, but move at a slower pace for the next 24 hours.  Take frequent rest periods for the next 24 hours.  Walking will help get rid of the air and reduce the bloated feeling in your belly (abdomen).  No driving for 24 hours (because of the medicine (anesthesia) used during the test).   Do not sign any important legal documents or operate any machinery for 24 hours (because of the anesthesia used during the test).  NUTRITION Drink plenty of fluids.  You may resume your normal diet as instructed by your doctor.  Begin with a light meal and progress to your normal diet. Heavy or fried foods are harder to digest and may make you feel sick to your stomach (nauseated).  Avoid alcoholic beverages for 24 hours or as instructed.  MEDICATIONS You may resume your normal medications unless your doctor tells you otherwise.  WHAT YOU CAN EXPECT TODAY Some feelings of bloating in the abdomen.  Passage of more gas than usual.  Spotting of blood in your stool or on the toilet paper.  IF YOU HAD POLYPS REMOVED DURING THE COLONOSCOPY: No aspirin products for 7 days or as instructed.  No alcohol  for 7 days or as instructed.  Eat a soft diet for the next 24 hours.  FINDING OUT THE RESULTS OF YOUR TEST Not all test results are available during your visit. If your test results are not back during the visit, make an appointment with your caregiver to find out the results. Do not assume everything is normal if you have not heard from your  caregiver or the medical facility. It is important for you to follow up on all of your test results.  SEEK IMMEDIATE MEDICAL ATTENTION IF: You have more than a spotting of blood in your stool.  Your belly is swollen (abdominal distention).  You are nauseated or vomiting.  You have a temperature over 101.  You have abdominal pain or discomfort that is severe or gets worse throughout the day.   Your colonoscopy was relatively unremarkable.  I did not find any polyps or evidence of colon cancer.  I recommend repeating colonoscopy in 10 years for colon cancer screening purposes.    Overall, your colon appeared very healthy.  I did not see any active inflammation indicative of underlying inflammatory bowel disease such as Crohn's disease or ulcerative colitis throughout your colon.  Continue on Linzess  daily. Follow up in 3 months. Message sent to the office and will call you to schedule an appointment.   I hope you have a great rest of your week!  Carlin POUR. Cindie, D.O. Gastroenterology and Hepatology [facility]

## 2024-02-19 NOTE — Anesthesia Preprocedure Evaluation (Signed)
 Anesthesia Evaluation  Patient identified by MRN, date of birth, ID band Patient awake    Reviewed: Allergy & Precautions, H&P , NPO status , Patient's Chart, lab work & pertinent test results, reviewed documented beta blocker date and time   Airway Mallampati: II  TM Distance: >3 FB Neck ROM: full    Dental no notable dental hx.    Pulmonary neg pulmonary ROS, former smoker   Pulmonary exam normal breath sounds clear to auscultation       Cardiovascular Exercise Tolerance: Good hypertension, negative cardio ROS  Rhythm:regular Rate:Normal     Neuro/Psych  Headaches PSYCHIATRIC DISORDERS Anxiety Depression     Neuromuscular disease    GI/Hepatic Neg liver ROS,GERD  ,,  Endo/Other  negative endocrine ROS    Renal/GU Renal disease  negative genitourinary   Musculoskeletal   Abdominal   Peds  Hematology negative hematology ROS (+)   Anesthesia Other Findings   Reproductive/Obstetrics negative OB ROS                              Anesthesia Physical Anesthesia Plan  ASA: 2  Anesthesia Plan: General   Post-op Pain Management:    Induction:   PONV Risk Score and Plan: Propofol  infusion  Airway Management Planned:   Additional Equipment:   Intra-op Plan:   Post-operative Plan:   Informed Consent: I have reviewed the patients History and Physical, chart, labs and discussed the procedure including the risks, benefits and alternatives for the proposed anesthesia with the patient or authorized representative who has indicated his/her understanding and acceptance.     Dental Advisory Given  Plan Discussed with: CRNA  Anesthesia Plan Comments:         Anesthesia Quick Evaluation

## 2024-02-19 NOTE — H&P (Signed)
 Primary Care Physician:  Jolee Elsie RAMAN, PA Primary Gastroenterologist:  Dr. Cindie  Pre-Procedure History & Physical: HPI:  Melanie Jimenez is a 64 y.o. female is here for a colonoscopy to be performed for surveillance purposes, personal history of adenomatous colon polyps in 2017  Past Medical History:  Diagnosis Date   Anxiety    Chronic kidney disease    kidney stones,blood in urine   Constipation - functional 01/20/2015   Degeneration of cartilage    left knee   Depression    GERD (gastroesophageal reflux disease)    Headache    Hot flashes 10/15/2013   Tinnitus 10/15/2013   Tinnitus of both ears     Past Surgical History:  Procedure Laterality Date   APPENDECTOMY     CESAREAN SECTION     COLONOSCOPY N/A 04/07/2016   Procedure: COLONOSCOPY;  Surgeon: Margo LITTIE Haddock, MD;  Location: [facility];  Service: Endoscopy;  Laterality: N/A;  9:30 Am   COLONOSCOPY N/A 06/09/2016   Procedure: COLONOSCOPY;  Surgeon: Margo LITTIE Haddock, MD;  Location: [facility];  Service: Endoscopy;  Laterality: N/A;  9:30 am   LAPAROSCOPIC APPENDECTOMY N/A 11/25/2014   Procedure: LAPAROSCOPIC APPENDECTOMY AND LAPAROSCOPIC EXCISION OF ABDOMINAL MASS;  Surgeon: Donnice Lima, MD;  Location: [facility];  Service: General;  Laterality: N/A;   lasix surgery     POLYPECTOMY  04/07/2016   Procedure: POLYPECTOMY;  Surgeon: Margo LITTIE Haddock, MD;  Location: [facility];  Service: Endoscopy;;  colon   TONSILECTOMY, ADENOIDECTOMY, BILATERAL MYRINGOTOMY AND TUBES     TONSILLECTOMY      Prior to Admission medications   Medication Sig Start Date End Date Taking? Authorizing Provider  alendronate (FOSAMAX) 70 MG tablet Take by mouth. 10/20/23  Yes [provider]  amLODipine (NORVASC) 5 MG tablet Take 5 mg by mouth daily. 06/09/21  Yes [provider]  aspirin EC 81 MG tablet Take 81 mg by mouth daily. 09/12/16  Yes [provider]  calcium  carbonate (OS-CAL - DOSED IN MG OF ELEMENTAL CALCIUM ) 1250  (500 Ca) MG tablet Take 1 tablet by mouth.   Yes [provider]  Eszopiclone 3 MG TABS Take 3 mg by mouth at bedtime. 08/29/22  Yes [provider]  linaclotide  (LINZESS ) 290 MCG CAPS capsule Take 1 capsule (290 mcg total) by mouth daily before breakfast. 01/16/24  Yes Ezzard Sonny RAMAN, PA-C  LORazepam (ATIVAN) 1 MG tablet Take 2 mg by mouth 2 (two) times daily. Patient taking differently: Take 2 mg by mouth 2 (two) times daily. prn 03/10/21  Yes [provider]  Multiple Vitamins-Minerals (BIOTIN PLUS/CALCIUM /VIT D3 PO) Take 500 mg by mouth daily.   Yes [provider]  Omega-3 Fatty Acids (FISH OIL) 1000 MG CAPS Take 1 capsule by mouth daily.   Yes [provider]  PARoxetine (PAXIL) 20 MG tablet Take 20 mg by mouth at bedtime. 08/29/22  Yes [provider]  ezetimibe  (ZETIA ) 10 MG tablet Take 1 tablet (10 mg total) by mouth daily. 11/12/23 02/10/24  Delford Maude BROCKS, MD  rosuvastatin  (CRESTOR ) 10 MG tablet Take 1 tablet (10 mg total) by mouth daily. 11/12/23 02/10/24  Delford Maude BROCKS, MD    Allergies as of 01/18/2024 - Review Complete 01/14/2024  Allergen Reaction Noted   Bupropion Other (See Comments) 02/12/2000    Family History  Problem Relation Age of Onset   Diabetes Maternal Grandmother    Diabetes Maternal Grandfather    Diabetes Father  Peripheral vascular disease Father    Cancer Mother        lung   Diabetes Mother    Hyperlipidemia Mother    Colon cancer Neg Hx     Social History   Socioeconomic History   Marital status: Married    Spouse name: Not on file   Number of children: 1   Years of education: 12   Highest education level: Not on file  Occupational History   Not on file  Tobacco Use   Smoking status: Former    Current packs/day: 1.00    Average packs/day: 1 pack/day for 47.1 years (47.1 ttl pk-yrs)    Types: Cigarettes    Start date: 01/22/1977   Smokeless tobacco: Never  Vaping Use   Vaping status:  Every Day  Substance and Sexual Activity   Alcohol  use: No    Alcohol /week: 0.0 standard drinks of alcohol    Drug use: No   Sexual activity: Not Currently    Birth control/protection: Post-menopausal, Surgical    Comment: vasectomy  Other Topics Concern   Not on file  Social History Narrative   Patient is married with one child.   Patient is right handed.   Patient has hs education.   Patient drinks 2-3 cups daily.   Social Drivers of Corporate investment banker Strain: Low Risk  (12/11/2023)   Overall Financial Resource Strain (CARDIA)    Difficulty of Paying Living Expenses: Not hard at all  Food Insecurity: No Food Insecurity (12/11/2023)   Hunger Vital Sign    Worried About Running Out of Food in the Last Year: Never true    Ran Out of Food in the Last Year: Never true  Transportation Needs: No Transportation Needs (12/11/2023)   PRAPARE - Administrator, Civil Service (Medical): No    Lack of Transportation (Non-Medical): No  Physical Activity: Insufficiently Active (12/11/2023)   Exercise Vital Sign    Days of Exercise per Week: 2 days    Minutes of Exercise per Session: 10 min  Stress: Stress Concern Present (12/11/2023)   [employer] of Occupational Health - Occupational Stress Questionnaire    Feeling of Stress : Very much  Social Connections: Moderately Integrated (12/11/2023)   Social Connection and Isolation Panel    Frequency of Communication with Friends and Family: Three times a week    Frequency of Social Gatherings with Friends and Family: Once a week    Attends Religious Services: 1 to 4 times per year    Active Member of [employer] or Organizations: No    Attends Banker Meetings: Never    Marital Status: Married  Catering manager Violence: Not At Risk (12/11/2023)   Humiliation, Afraid, Rape, and Kick questionnaire    Fear of Current or Ex-Partner: No    Emotionally Abused: No    Physically Abused: No    Sexually Abused: No     Review of Systems: See HPI, otherwise negative ROS  Physical Exam: Vital signs in last 24 hours: Temp:  [98.4 F (36.9 C)] 98.4 F (36.9 C) (08/19 1117) Pulse Rate:  [77] 77 (08/19 1117) Resp:  [12] 12 (08/19 1117) BP: (143)/(84) 143/84 (08/19 1117) SpO2:  [97 %] 97 % (08/19 1117) Weight:  [72.6 kg] 72.6 kg (08/19 1117)   General:   Alert,  Well-developed, well-nourished, pleasant and cooperative in NAD Head:  Normocephalic and atraumatic. Eyes:  Sclera clear, no icterus.   Conjunctiva pink. Ears:  Normal auditory acuity.  Nose:  No deformity, discharge,  or lesions. Msk:  Symmetrical without gross deformities. Normal posture. Extremities:  Without clubbing or edema. Neurologic:  Alert and  oriented x4;  grossly normal neurologically. Skin:  Intact without significant lesions or rashes. Psych:  Alert and cooperative. Normal mood and affect.  Impression/Plan: Melanie Jimenez is here for a colonoscopy to be performed for surveillance purposes, personal history of adenomatous colon polyps in 2017  The risks of the procedure including infection, bleed, or perforation as well as benefits, limitations, alternatives and imponderables have been reviewed with the patient. Questions have been answered. All parties agreeable.

## 2024-02-19 NOTE — Transfer of Care (Addendum)
 Immediate Anesthesia Transfer of Care Note  Patient: Melanie Jimenez  Procedure(s) Performed: COLONOSCOPY  Patient Location: Endoscopy Unit  Anesthesia Type:General  Level of Consciousness: drowsy and patient cooperative  Airway & Oxygen Therapy: Patient Spontanous Breathing  Post-op Assessment: Report given to RN and Post -op Vital signs reviewed and stable  Post vital signs: Reviewed and stable  Last Vitals:  Vitals Value Taken Time  BP 103/63 02/19/24   1153  Temp 36.7 02/19/24   1153  Pulse 67 02/19/24   1153  Resp 15 02/19/24   1153  SpO2 95% 02/19/24   1153    Last Pain:  Vitals:   02/19/24 1117  TempSrc: Oral  PainSc: 0-No pain      Patients Stated Pain Goal: 4 (02/19/24 1117)  Complications: No notable events documented.

## 2024-02-21 ENCOUNTER — Encounter (HOSPITAL_COMMUNITY): Payer: Self-pay | Admitting: Internal Medicine

## 2024-02-29 DIAGNOSIS — M79671 Pain in right foot: Secondary | ICD-10-CM | POA: Diagnosis not present

## 2024-02-29 DIAGNOSIS — Z6829 Body mass index (BMI) 29.0-29.9, adult: Secondary | ICD-10-CM | POA: Diagnosis not present

## 2024-02-29 DIAGNOSIS — R2231 Localized swelling, mass and lump, right upper limb: Secondary | ICD-10-CM | POA: Diagnosis not present

## 2024-03-10 NOTE — Progress Notes (Signed)
 History of Present Illness: Melanie Jimenez is a 63 y.o. year old female here for cysto for persistent microscopic hematuria. She does have a  h/o tobacco abuse.  She is having persistent left lower quadrant pain.  She has had normal colonoscopy by report.  No gross hematuria.  Longstanding microscopic hematuria.  Past Medical History:  Diagnosis Date   Anxiety    Chronic kidney disease    kidney stones,blood in urine   Constipation - functional 01/20/2015   Degeneration of cartilage    left knee   Depression    GERD (gastroesophageal reflux disease)    Headache    Hot flashes 10/15/2013   Tinnitus 10/15/2013   Tinnitus of both ears     Past Surgical History:  Procedure Laterality Date   APPENDECTOMY     CESAREAN SECTION     COLONOSCOPY N/A 04/07/2016   Procedure: COLONOSCOPY;  Surgeon: Margo LITTIE Haddock, MD;  [facility]: [facility];  Service: Endoscopy;  Laterality: N/A;  9:30 Am   COLONOSCOPY N/A 06/09/2016   Procedure: COLONOSCOPY;  Surgeon: Margo LITTIE Haddock, MD;  [facility];  Service: Endoscopy;  Laterality: N/A;  9:30 am   COLONOSCOPY N/A 02/19/2024   Procedure: COLONOSCOPY;  Surgeon: Cindie Carlin POUR, DO;  [facility]: [facility];  Service: Endoscopy;  Laterality: N/A;  12:45 PM, ASA 2   LAPAROSCOPIC APPENDECTOMY N/A 11/25/2014   Procedure: LAPAROSCOPIC APPENDECTOMY AND LAPAROSCOPIC EXCISION OF ABDOMINAL MASS;  Surgeon: Donnice Lima, MD;  Location: [facility];  Service: General;  Laterality: N/A;   lasix surgery     POLYPECTOMY  04/07/2016   Procedure: POLYPECTOMY;  Surgeon: Margo LITTIE Haddock, MD;  [facility];  Service: Endoscopy;;  colon   TONSILECTOMY, ADENOIDECTOMY, BILATERAL MYRINGOTOMY AND TUBES     TONSILLECTOMY      Home Medications:  (Not in a hospital admission)   Allergies:  Allergies  Allergen Reactions   Bupropion Other (See Comments)    Wellbutrin-Seizures and passed out  Other Reaction(s): decreased blood pressure    Family History   Problem Relation Age of Onset   Diabetes Maternal Grandmother    Diabetes Maternal Grandfather    Diabetes Father    Peripheral vascular disease Father    Cancer Mother        lung   Diabetes Mother    Hyperlipidemia Mother    Colon cancer Neg Hx     Social History:  reports that she has quit smoking. Her smoking use included cigarettes. She started smoking about 47 years ago. She has a 47.1 pack-year smoking history. She has never used smokeless tobacco. She reports that she does not drink alcohol  and does not use drugs.  ROS: A complete review of systems was performed.  All systems are negative except for pertinent findings as noted.  Indication: Hematuria  After informed consent and discussion of the procedure and its risks, pt was positioned and prepped in the standard fashion.  Cystoscopy was performed with a flexible cystoscope.   Findings:  Urethra: Normal Ureteral orifices: Normally positioned and configured bilaterally Bladder: Smooth urothelium, no tumors, no trabeculations, no foreign bodies   I have reviewed prior pt notes  I have reviewed notes from referring/previous physicians  I have reviewed prior urinalysis results  I have independently reviewed prior imaging--CT images from July 2025 reviewed.  Small calculi in left kidney, upper pole.  No hydronephrosis    Impression/Assessment:  Persistent microscopic hematuria with negative CT (except for  left upper pole urolithiasis) and now cystoscopy  Small left upper pole stones, I do not think clinically significant at this point  Plan:  1.  I will send urine for cytology  2.  I will have her drop in for recheck in about 6 months for urine recheck  Garnette HERO Lesia Monica 03/10/2024, 7:22 AM  Garnette HERO. Pammie Chirino MD

## 2024-03-11 ENCOUNTER — Ambulatory Visit: Admitting: Urology

## 2024-03-11 VITALS — BP 133/80 | HR 83

## 2024-03-11 DIAGNOSIS — N200 Calculus of kidney: Secondary | ICD-10-CM

## 2024-03-11 DIAGNOSIS — R3129 Other microscopic hematuria: Secondary | ICD-10-CM | POA: Diagnosis not present

## 2024-03-11 LAB — URINALYSIS, ROUTINE W REFLEX MICROSCOPIC
Bilirubin, UA: NEGATIVE
Glucose, UA: NEGATIVE
Ketones, UA: NEGATIVE
Leukocytes,UA: NEGATIVE
Nitrite, UA: NEGATIVE
Protein,UA: NEGATIVE
Specific Gravity, UA: 1.025 (ref 1.005–1.030)
Urobilinogen, Ur: 0.2 mg/dL (ref 0.2–1.0)
pH, UA: 6 (ref 5.0–7.5)

## 2024-03-11 LAB — MICROSCOPIC EXAMINATION
Bacteria, UA: NONE SEEN
RBC, Urine: >30 /HPF — AB (ref 0–2)

## 2024-03-11 MED ORDER — CIPROFLOXACIN HCL 500 MG PO TABS
500.0000 mg | ORAL_TABLET | Freq: Once | ORAL | Status: AC
  Administered 2024-03-11: 500 mg via ORAL

## 2024-03-12 LAB — CYTOLOGY, URINE

## 2024-03-17 ENCOUNTER — Ambulatory Visit: Payer: Self-pay | Admitting: Urology

## 2024-03-21 DIAGNOSIS — Z87891 Personal history of nicotine dependence: Secondary | ICD-10-CM | POA: Diagnosis not present

## 2024-03-21 DIAGNOSIS — Z122 Encounter for screening for malignant neoplasm of respiratory organs: Secondary | ICD-10-CM | POA: Diagnosis not present

## 2024-03-24 DIAGNOSIS — N644 Mastodynia: Secondary | ICD-10-CM | POA: Diagnosis not present

## 2024-03-24 DIAGNOSIS — R2231 Localized swelling, mass and lump, right upper limb: Secondary | ICD-10-CM | POA: Diagnosis not present

## 2024-03-25 ENCOUNTER — Telehealth: Payer: Self-pay

## 2024-03-25 NOTE — Telephone Encounter (Signed)
 Pt was made aware and verbalized understanding Patient did not get her MyChart message about cytology.  Let her know that urine cytology was normal.

## 2024-03-26 DIAGNOSIS — H43811 Vitreous degeneration, right eye: Secondary | ICD-10-CM | POA: Diagnosis not present

## 2024-04-02 ENCOUNTER — Encounter: Payer: Self-pay | Admitting: Gastroenterology

## 2024-04-10 DIAGNOSIS — R739 Hyperglycemia, unspecified: Secondary | ICD-10-CM | POA: Diagnosis not present

## 2024-05-26 ENCOUNTER — Telehealth: Payer: Self-pay

## 2024-05-26 NOTE — Telephone Encounter (Signed)
 Return call to pt with no answer regarding owned balance.SABRA LVM with billing department phone number.

## 2024-06-04 ENCOUNTER — Ambulatory Visit: Admitting: Gastroenterology

## 2024-06-04 ENCOUNTER — Encounter: Payer: Self-pay | Admitting: Gastroenterology

## 2024-06-04 VITALS — BP 133/72 | HR 92 | Temp 99.0°F | Ht 63.0 in | Wt 166.8 lb

## 2024-06-04 DIAGNOSIS — K760 Fatty (change of) liver, not elsewhere classified: Secondary | ICD-10-CM | POA: Insufficient documentation

## 2024-06-04 DIAGNOSIS — K5900 Constipation, unspecified: Secondary | ICD-10-CM | POA: Diagnosis not present

## 2024-06-04 DIAGNOSIS — R1032 Left lower quadrant pain: Secondary | ICD-10-CM

## 2024-06-04 MED ORDER — LINACLOTIDE 290 MCG PO CAPS: 290.0000 ug | ORAL_CAPSULE | Freq: Every day | ORAL | 11 refills | Status: AC

## 2024-06-04 NOTE — Patient Instructions (Addendum)
 Continue Linzess  once daily as needed for constipation and left lower abdominal pain. We will need to see you at least once every 2 years to maintain prescription.  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  -continue to exercise as tolerated, goal of at least 150 minutes/week -consume low fat/low sugar diet -drinking 2 cups of coffee daily as been seen to reduce liver inflammation in fatty liver -try to eat at least 30 grams of protein with each meal, for at least 100 grams per day -limit alcohol  consumption to rare use -strive for 10% reduction in body weight (16 pounds)  You should have labs done at least once yearly so your FIB4 score can be calculated. You need Hepatic Function Panel AND complete blood count (platelet count). You can have your PCP fax labs to me or we can order for you when needed. As long as your FIB4 score remains favorable, we will see you in two years.

## 2024-06-04 NOTE — Progress Notes (Signed)
 GI Office Note    Referring Provider: Jolee Elsie RAMAN, PA Primary Care Physician:  Jolee Greig VEAR DEVONNA  Primary Gastroenterologist: Carlin POUR. Cindie, DO   Chief Complaint   Chief Complaint  Patient presents with   Follow-up    Doing well, no issues    History of Present Illness   Melanie Jimenez is a 63 y.o. female presenting today for follow up. She was last seen in the office in 01/2024. Two month history of LLQ pain and CT with ?abnormal sigmoid colon. Also with constipation, hepatic steatosis (FIB 4 of 1.11 excludes advanced fibrosis), history of colon polyps.   Discussed the use of AI scribe software for clinical note transcription with the patient, who gave verbal consent to proceed.  History of Present Illness Melanie Jimenez is a 63 year old female with suspected IBS-C, fatty liver returning for follow up.    She has experienced significant improvement in her abdominal pain since starting Linzess . Initially, the pain was severe, but it is now approximately eighty percent better. Does not occur daily. She takes Linzess  as needed, about two to three times a week, when she feels discomfort or has not had a good bowel movement. Initially, she took it daily but has since reduced the frequency. The medication helps alleviate her symptoms without causing diarrhea, resulting in regular bowel movements. No melena, brbpr. A recent colonoscopy showed no structural abnormalities.    She has a history of mild fatty liver disease identified on a CT scan done for abdominal pain. She does not have diabetes or HTN. She has some obesity and high cholesterol.   She has gained approximately twenty pounds over the past year.    Wt Readings from Last 3 Encounters:  06/04/24 166 lb 12.8 oz (75.7 kg)  02/19/24 160 lb (72.6 kg)  01/07/24 166 lb 3.2 oz (75.4 kg)    Prior Data     CT abdomen pelvis with contrast January 13, 2024: IMPRESSION: -No acute findings. -Bilateral nephrolithiasis. No  evidence of ureteral calculi or hydronephrosis. -Mild hepatic steatosis.  Colonoscopy February 19, 2024: - Nonbleeding internal hemorrhoids - Exam otherwise without abnormality - Colonoscopy in 10 years  Medications   Current Outpatient Medications  Medication Sig Dispense Refill   alendronate (FOSAMAX) 70 MG tablet Take by mouth.     amLODipine (NORVASC) 5 MG tablet Take 5 mg by mouth daily.     aspirin EC 81 MG tablet Take 81 mg by mouth daily.     calcium  carbonate (OS-CAL - DOSED IN MG OF ELEMENTAL CALCIUM ) 1250 (500 Ca) MG tablet Take 1 tablet by mouth.     Eszopiclone 3 MG TABS Take 3 mg by mouth at bedtime.     ezetimibe  (ZETIA ) 10 MG tablet Take 1 tablet (10 mg total) by mouth daily. 90 tablet 3   linaclotide  (LINZESS ) 290 MCG CAPS capsule Take 1 capsule (290 mcg total) by mouth daily before breakfast. 30 capsule 5   LORazepam (ATIVAN) 1 MG tablet Take 2 mg by mouth 2 (two) times daily. (Patient taking differently: Take 2 mg by mouth 2 (two) times daily. prn)     Multiple Vitamins-Minerals (BIOTIN PLUS/CALCIUM /VIT D3 PO) Take 500 mg by mouth daily.     Omega-3 Fatty Acids (FISH OIL) 1000 MG CAPS Take 1 capsule by mouth daily.     PARoxetine (PAXIL) 20 MG tablet Take 20 mg by mouth at bedtime.     rosuvastatin  (CRESTOR ) 20 MG tablet  Take 20 mg by mouth daily.     No current facility-administered medications for this visit.    Allergies   Allergies as of 06/04/2024 - Review Complete 06/04/2024  Allergen Reaction Noted   Bupropion Other (See Comments) 02/12/2000       Review of Systems   General: Negative for anorexia, weight loss, fever, chills, fatigue, weakness. ENT: Negative for hoarseness, difficulty swallowing , nasal congestion. Chronic tinnitus  CV: Negative for chest pain, angina, palpitations, dyspnea on exertion, peripheral edema.  Respiratory: Negative for dyspnea at rest, dyspnea on exertion, cough, sputum, wheezing.  GI: See history of present illness. GU:   Negative for dysuria, hematuria, urinary incontinence, urinary frequency, nocturnal urination.  Endo: Negative for unusual weight change.     Physical Exam   BP 133/72   Pulse 92   Temp 99 F (37.2 C) (Oral)   Ht 5' 3 (1.6 m)   Wt 166 lb 12.8 oz (75.7 kg)   SpO2 97%   BMI 29.55 kg/m    General: Well-nourished, well-developed in no acute distress.  Eyes: No icterus. Mouth: Oropharyngeal mucosa moist and pink   Abdomen: Bowel sounds are normal, nontender, nondistended, no hepatosplenomegaly or masses,  no abdominal bruits or hernia , no rebound or guarding.  Rectal: not performed Extremities: No lower extremity edema. No clubbing or deformities. Neuro: Alert and oriented x 4   Skin: Warm and dry, no jaundice.   Psych: Alert and cooperative, normal mood and affect.  Labs    Labs 11/2023: White blood cell count 7.4, hemoglobin 13.1, hematocrit 38.5, MCV 92.1, platelets 213,000, sodium 140, potassium 4.1, BUN 19, creatinine 0.73, albumin 4.2, total bilirubin 0.3, alk phos 58, AST 22, ALT 31H, TSH 0.914  Imaging Studies   No results found.  Assessment/Plan:    Assessment & Plan LLQ pain/constipation suspect IBS-C Significant improvement with Linzess , 80% symptom relief. Does not have pain daily. Improves with BM/Linzess . Colonoscopy reassuring.    - Continue Linzess  290mcg daily as needed, approximately twice a week. New refill provided - Can see her every 2 years assuming no change in symptoms  Hepatic steatosis Mild fatty liver on CT. No chronic liver disease or fibrosis. Risk factors: hyperlipidemia, obesity. - Continue annual liver function tests, including platelet count so her FIB-4 can be calculated - Implement lifestyle modifications to reduce risk factors. - Encourage weight loss of 10% of body weight. - Regular exercise (150 minutes per week) as tolerated. - Provided information on lifestyle modifications. - Encouraged to limit etoh to rare use, she does not  consume etoh regularly anyway - Low fat/low carb diet. - Two cups of coffee daily - Consume at least 30g protein with each meal and goal of minimum of 100g per day - As long as her FIB-4 remains low risk for fibrosis, we can see her in two years.   Abdominal pain associated with functional constipation Pain likely related to functional constipation and possibly irritable bowel syndrome. Improves with Linzess  use. - Continue Linzess  as needed for constipation and associated pain. - Monitor for changes in pain pattern or severity.        Sonny RAMAN. Ezzard, MHS, PA-C [facility]

## 2024-07-16 ENCOUNTER — Ambulatory Visit: Admitting: Urology

## 2024-09-16 ENCOUNTER — Ambulatory Visit: Admitting: Urology
